# Patient Record
Sex: Female | Born: 1990 | Race: Black or African American | Hispanic: No | Marital: Single | Smoking: Never smoker
Health system: Southern US, Community
[De-identification: ages and names within clinical notes are randomized; demographics above are authoritative.]

## PROBLEM LIST (undated history)

## (undated) DIAGNOSIS — Z87442 Personal history of urinary calculi: Secondary | ICD-10-CM

## (undated) DIAGNOSIS — Z8379 Family history of other diseases of the digestive system: Secondary | ICD-10-CM

## (undated) DIAGNOSIS — N200 Calculus of kidney: Secondary | ICD-10-CM

## (undated) DIAGNOSIS — Z8659 Personal history of other mental and behavioral disorders: Secondary | ICD-10-CM

## (undated) DIAGNOSIS — Z87440 Personal history of urinary (tract) infections: Secondary | ICD-10-CM

## (undated) HISTORY — DX: Personal history of other mental and behavioral disorders: Z86.59

## (undated) HISTORY — DX: Personal history of urinary calculi: Z87.442

## (undated) HISTORY — DX: Family history of other diseases of the digestive system: Z83.79

## (undated) HISTORY — DX: Personal history of urinary (tract) infections: Z87.440

## (undated) NOTE — *Deleted (*Deleted)
07/30/2020 12:10 PM   Kathleen Kelley 1990/12/19 [id number]  Referring provider: Deborha Payment, PA-C [address] [place],  [place] [address] No chief complaint on file.   HPI: Kathleen Kelley is a 41 y.o. female who presents today for evaluation and management of nephrolithiasis.  -Patient has a history of nephrolithiasis. -She was previously seen at [facility] in 11/2019. -She had back pain, nausea, vomiting and back pain radiating to her LLQ. Pain was 10/10.  -UA noted 1+ blood and 1+ leukocytes. Urine culture was normal. She was given Flomax.  -She reports lower left sided back pain, lower abdominal pain and urgency x 5 days on 04/13/2020. -Back and flank pain were intermittent.  -Associated nausea, diarrhea, frequency, dysuria and abdominal discomfort.  -UA noted trace blood and rare bacteria. No urine culture. Treated with Cipro 500 mg BID x 7 days.       PMH: No past medical history on file.  Surgical History: No past surgical history on file.  Home Medications:  Allergies as of 07/30/2020   No Known Allergies     Medication List       Accurate as of July 29, 2020 12:10 PM. If you have any questions, ask your nurse or doctor.        azithromycin 250 MG tablet Commonly known as: Zithromax Z-Pak Take 2 tablets (500 mg) on  Day 1,  followed by 1 tablet (250 mg) once daily on Days 2 through 5.   guaiFENesin-codeine 100-10 MG/5ML syrup Take 10 mLs by mouth every 4 (four) hours as needed for cough.   PARoxetine 30 MG tablet Commonly known as: PAXIL Take 30 mg by mouth daily.   phenazopyridine 200 MG tablet Commonly known as: Pyridium Take 1 tablet (200 mg total) by mouth 3 (three) times daily as needed for pain.   pseudoephedrine 60 MG tablet Commonly known as: SUDAFED Take 1 tablet (60 mg total) by mouth every 4 (four) hours as needed for congestion.       Allergies: No Known Allergies  Family History: No family history on  file.  Social History:  reports that she has never smoked. She has never used smokeless tobacco. She reports that she does not drink alcohol and does not use drugs.   Physical Exam: There were no vitals taken for this visit.  Constitutional:  Alert and oriented, No acute distress. HEENT: [place] AT, moist mucus membranes.  Trachea midline, no masses. Cardiovascular: No clubbing, cyanosis, or edema. Respiratory: Normal respiratory effort, no increased work of breathing. GI: Abdomen is soft, nontender, nondistended, no abdominal masses GU: No CVA tenderness Lymph: No cervical or inguinal lymphadenopathy. Skin: No rashes, bruises or suspicious lesions. Neurologic: Grossly intact, no focal deficits, moving all 4 extremities. Psychiatric: Normal mood and affect.  Laboratory Data:  No results found for: CREATININE  No results found for: PSA  No results found for: TESTOSTERONE  No results found for: HGBA1C  Urinalysis   Pertinent Imaging: *** No results found for this or any previous visit.  No results found for this or any previous visit.  No results found for this or any previous visit.  No results found for this or any previous visit.  No results found for this or any previous visit.  No results found for this or any previous visit.  No results found for this or any previous visit.  No results found for this or any previous visit.   Assessment & Plan:  No follow-ups on file.  [facility] [address], [address] [place], [place] [address] [phone]  I, Theador Hawthorne, am acting as a scribe for Dr. Lorin Picket C. Stoioff,  {Add Holiday representative

---

## 2015-09-07 ENCOUNTER — Emergency Department
Admission: EM | Admit: 2015-09-07 | Discharge: 2015-09-07 | Disposition: A | Discharge status: Home / Self Care | Payer: Self-pay | Attending: Emergency Medicine | Admitting: Emergency Medicine

## 2015-09-07 ENCOUNTER — Encounter: Payer: Self-pay | Admitting: Emergency Medicine

## 2015-09-07 DIAGNOSIS — J069 Acute upper respiratory infection, unspecified: Secondary | ICD-10-CM | POA: Insufficient documentation

## 2015-09-07 DIAGNOSIS — Z79899 Other long term (current) drug therapy: Secondary | ICD-10-CM | POA: Insufficient documentation

## 2015-09-07 DIAGNOSIS — J0100 Acute maxillary sinusitis, unspecified: Secondary | ICD-10-CM | POA: Insufficient documentation

## 2015-09-07 MED ORDER — AZITHROMYCIN 250 MG PO TABS: ORAL_TABLET | ORAL | Status: DC

## 2015-09-07 MED ORDER — PSEUDOEPHEDRINE HCL 60 MG PO TABS: 60.0000 mg | ORAL_TABLET | ORAL | Status: DC | PRN

## 2015-09-07 MED ORDER — GUAIFENESIN-CODEINE 100-10 MG/5ML PO SOLN: 10.0000 mL | ORAL | Status: DC | PRN

## 2015-09-07 NOTE — ED Notes (Signed)
States she developed some sinus pressure about 4-5 days ago  Swelling noted under chin/ears  States she has green drainage for nose when she blows her nose

## 2015-09-07 NOTE — Discharge Instructions (Signed)
Cough, Adult A cough helps to clear your throat and lungs. A cough may last only 2-3 weeks (acute), or it may last longer than 8 weeks (chronic). Many different things can cause a cough. A cough may be a sign of an illness or another medical condition. HOME CARE  Pay attention to any changes in your cough.  Take medicines only as told by your doctor.  If you were prescribed an antibiotic medicine, take it as told by your doctor. Do not stop taking it even if you start to feel better.  Talk with your doctor before you try using a cough medicine.  Drink enough fluid to keep your pee (urine) clear or pale yellow.  If the air is dry, use a cold steam vaporizer or humidifier in your home.  Stay away from things that make you cough at work or at home.  If your cough is worse at night, try using extra pillows to raise your head up higher while you sleep.  Do not smoke, and try not to be around smoke. If you need help quitting, ask your doctor.  Do not have caffeine.  Do not drink alcohol.  Rest as needed. GET HELP IF:  You have new problems (symptoms).  You cough up yellow fluid (pus).  Your cough does not get better after 2-3 weeks, or your cough gets worse.  Medicine does not help your cough and you are not sleeping well.  You have pain that gets worse or pain that is not helped with medicine.  You have a fever.  You are losing weight and you do not know why.  You have night sweats. GET HELP RIGHT AWAY IF:  You cough up blood.  You have trouble breathing.  Your heartbeat is very fast.   This information is not intended to replace advice given to you by your health care provider. Make sure you discuss any questions you have with your health care provider.   Document Released: 05/26/2011 Document Revised: 06/03/2015 Document Reviewed: 11/19/2014 Elsevier Interactive Patient Education 2016 Elsevier Inc.  Sinusitis, Adult Sinusitis is redness, soreness, and  inflammation of the paranasal sinuses. Paranasal sinuses are air pockets within the bones of your face. They are located beneath your eyes, in the middle of your forehead, and above your eyes. In healthy paranasal sinuses, mucus is able to drain out, and air is able to circulate through them by way of your nose. However, when your paranasal sinuses are inflamed, mucus and air can become trapped. This can allow bacteria and other germs to grow and cause infection. Sinusitis can develop quickly and last only a short time (acute) or continue over a long period (chronic). Sinusitis that lasts for more than 12 weeks is considered chronic. CAUSES Causes of sinusitis include:  Allergies.  Structural abnormalities, such as displacement of the cartilage that separates your nostrils (deviated septum), which can decrease the air flow through your nose and sinuses and affect sinus drainage.  Functional abnormalities, such as when the small hairs (cilia) that line your sinuses and help remove mucus do not work properly or are not present. SIGNS AND SYMPTOMS Symptoms of acute and chronic sinusitis are the same. The primary symptoms are pain and pressure around the affected sinuses. Other symptoms include:  Upper toothache.  Earache.  Headache.  Bad breath.  Decreased sense of smell and taste.  A cough, which worsens when you are lying flat.  Fatigue.  Fever.  Thick drainage from your nose, which often  is green and may contain pus (purulent).  Swelling and warmth over the affected sinuses. DIAGNOSIS Your health care provider will perform a physical exam. During your exam, your health care provider may perform any of the following to help determine if you have acute sinusitis or chronic sinusitis:  Look in your nose for signs of abnormal growths in your nostrils (nasal polyps).  Tap over the affected sinus to check for signs of infection.  View the inside of your sinuses using an imaging  device that has a light attached (endoscope). If your health care provider suspects that you have chronic sinusitis, one or more of the following tests may be recommended:  Allergy tests.  Nasal culture. A sample of mucus is taken from your nose, sent to a lab, and screened for bacteria.  Nasal cytology. A sample of mucus is taken from your nose and examined by your health care provider to determine if your sinusitis is related to an allergy. TREATMENT Most cases of acute sinusitis are related to a viral infection and will resolve on their own within 10 days. Sometimes, medicines are prescribed to help relieve symptoms of both acute and chronic sinusitis. These may include pain medicines, decongestants, nasal steroid sprays, or saline sprays. However, for sinusitis related to a bacterial infection, your health care provider will prescribe antibiotic medicines. These are medicines that will help kill the bacteria causing the infection. Rarely, sinusitis is caused by a fungal infection. In these cases, your health care provider will prescribe antifungal medicine. For some cases of chronic sinusitis, surgery is needed. Generally, these are cases in which sinusitis recurs more than 3 times per year, despite other treatments. HOME CARE INSTRUCTIONS  Drink plenty of water. Water helps thin the mucus so your sinuses can drain more easily.  Use a humidifier.  Inhale steam 3-4 times a day (for example, sit in the bathroom with the shower running).  Apply a warm, moist washcloth to your face 3-4 times a day, or as directed by your health care provider.  Use saline nasal sprays to help moisten and clean your sinuses.  Take medicines only as directed by your health care provider.  If you were prescribed either an antibiotic or antifungal medicine, finish it all even if you start to feel better. SEEK IMMEDIATE MEDICAL CARE IF:  You have increasing pain or severe headaches.  You have nausea,  vomiting, or drowsiness.  You have swelling around your face.  You have vision problems.  You have a stiff neck.  You have difficulty breathing.   This information is not intended to replace advice given to you by your health care provider. Make sure you discuss any questions you have with your health care provider.   Document Released: 09/12/2005 Document Revised: 10/03/2014 Document Reviewed: 09/27/2011 Elsevier Interactive Patient Education 2016 Elsevier Inc.  Upper Respiratory Infection, Adult Most upper respiratory infections (URIs) are a viral infection of the air passages leading to the lungs. A URI affects the nose, throat, and upper air passages. The most common type of URI is nasopharyngitis and is typically referred to as "the common cold." URIs run their course and usually go away on their own. Most of the time, a URI does not require medical attention, but sometimes a bacterial infection in the upper airways can follow a viral infection. This is called a secondary infection. Sinus and middle ear infections are common types of secondary upper respiratory infections. Bacterial pneumonia can also complicate a URI. A URI can  worsen asthma and chronic obstructive pulmonary disease (COPD). Sometimes, these complications can require emergency medical care and may be life threatening.  CAUSES Almost all URIs are caused by viruses. A virus is a type of germ and can spread from one person to another.  RISKS FACTORS You may be at risk for a URI if:   You smoke.   You have chronic heart or lung disease.  You have a weakened defense (immune) system.   You are very young or very old.   You have nasal allergies or asthma.  You work in crowded or poorly ventilated areas.  You work in health care facilities or schools. SIGNS AND SYMPTOMS  Symptoms typically develop 2-3 days after you come in contact with a cold virus. Most viral URIs last 7-10 days. However, viral URIs from the  influenza virus (flu virus) can last 14-18 days and are typically more severe. Symptoms may include:   Runny or stuffy (congested) nose.   Sneezing.   Cough.   Sore throat.   Headache.   Fatigue.   Fever.   Loss of appetite.   Pain in your forehead, behind your eyes, and over your cheekbones (sinus pain).  Muscle aches.  DIAGNOSIS  Your health care provider may diagnose a URI by:  Physical exam.  Tests to check that your symptoms are not due to another condition such as:  Strep throat.  Sinusitis.  Pneumonia.  Asthma. TREATMENT  A URI goes away on its own with time. It cannot be cured with medicines, but medicines may be prescribed or recommended to relieve symptoms. Medicines may help:  Reduce your fever.  Reduce your cough.  Relieve nasal congestion. HOME CARE INSTRUCTIONS   Take medicines only as directed by your health care provider.   Gargle warm saltwater or take cough drops to comfort your throat as directed by your health care provider.  Use a warm mist humidifier or inhale steam from a shower to increase air moisture. This may make it easier to breathe.  Drink enough fluid to keep your urine clear or pale yellow.   Eat soups and other clear broths and maintain good nutrition.   Rest as needed.   Return to work when your temperature has returned to normal or as your health care provider advises. You may need to stay home longer to avoid infecting others. You can also use a face mask and careful hand washing to prevent spread of the virus.  Increase the usage of your inhaler if you have asthma.   Do not use any tobacco products, including cigarettes, chewing tobacco, or electronic cigarettes. If you need help quitting, ask your health care provider. PREVENTION  The best way to protect yourself from getting a cold is to practice good hygiene.   Avoid oral or hand contact with people with cold symptoms.   Wash your hands often if  contact occurs.  There is no clear evidence that vitamin C, vitamin E, echinacea, or exercise reduces the chance of developing a cold. However, it is always recommended to get plenty of rest, exercise, and practice good nutrition.  SEEK MEDICAL CARE IF:   You are getting worse rather than better.   Your symptoms are not controlled by medicine.   You have chills.  You have worsening shortness of breath.  You have brown or red mucus.  You have yellow or brown nasal discharge.  You have pain in your face, especially when you bend forward.  You have a  fever.  You have swollen neck glands.  You have pain while swallowing.  You have white areas in the back of your throat. SEEK IMMEDIATE MEDICAL CARE IF:   You have severe or persistent:  Headache.  Ear pain.  Sinus pain.  Chest pain.  You have chronic lung disease and any of the following:  Wheezing.  Prolonged cough.  Coughing up blood.  A change in your usual mucus.  You have a stiff neck.  You have changes in your:  Vision.  Hearing.  Thinking.  Mood. MAKE SURE YOU:   Understand these instructions.  Will watch your condition.  Will get help right away if you are not doing well or get worse.   This information is not intended to replace advice given to you by your health care provider. Make sure you discuss any questions you have with your health care provider.   Document Released: 03/08/2001 Document Revised: 01/27/2015 Document Reviewed: 12/18/2013 Elsevier Interactive Patient Education [employer]

## 2015-09-07 NOTE — ED Provider Notes (Signed)
[facility] Emergency Department Provider Note  ____________________________________________  Time seen: Approximately 6:43 PM  I have reviewed the triage vital signs and the nursing notes.   HISTORY  Chief Complaint Facial Pain and Sore Throat    HPI Sherilyn CooterShanice Kelley is a 24 y.o. female who presents for evaluation of sinus pain and fever sore throat for 4 days. Patient states bloody discharge when blowing her nose. In addition screening discharge mixed in with it. Patient denies any active fever. Takes Tylenol over-the-counter as needed.   History reviewed. No pertinent past medical history.  There are no active problems to display for this patient.   History reviewed. No pertinent past surgical history.  Current Outpatient Rx  Name  Route  Sig  Dispense  Refill  . PARoxetine (PAXIL) 30 MG tablet   Oral   Take 30 mg by mouth daily.         Marland Kitchen. azithromycin (ZITHROMAX Z-PAK) 250 MG tablet      Take 2 tablets (500 mg) on  Day 1,  followed by 1 tablet (250 mg) once daily on Days 2 through 5.   6 each   0   . guaiFENesin-codeine 100-10 MG/5ML syrup   Oral   Take 10 mLs by mouth every 4 (four) hours as needed for cough.   180 mL   0   . pseudoephedrine (SUDAFED) 60 MG tablet   Oral   Take 1 tablet (60 mg total) by mouth every 4 (four) hours as needed for congestion.   24 tablet   0     Allergies Review of patient's allergies indicates no known allergies.  No family history on file.  Social History Social History  Substance Use Topics  . Smoking status: Never Smoker   . Smokeless tobacco: None  . Alcohol Use: No    Review of Systems Constitutional: No fever/chills Eyes: No visual changes. ENT: Positive sore throat. Positive sinus congestion and pressure. The nasal discharge. Cardiovascular: Denies chest pain. Respiratory: Denies shortness of breath. Visual cough. Gastrointestinal: No abdominal pain.  No nausea, no vomiting.  No  diarrhea.  No constipation. Genitourinary: Negative for dysuria. Musculoskeletal: Negative for back pain. Positive for body aches. Skin: Negative for rash. Neurological: Negative for headaches, focal weakness or numbness.  10-point ROS otherwise negative.  ____________________________________________   PHYSICAL EXAM:  VITAL SIGNS: ED Triage Vitals  Enc Vitals Group     BP 09/07/15 1759 148/84 mmHg     Pulse Rate 09/07/15 1757 87     Resp 09/07/15 1757 18     Temp 09/07/15 1757 98.9 F (37.2 C)     Temp Source 09/07/15 1757 Oral     SpO2 09/07/15 1757 98 %     Weight 09/07/15 1757 220 lb (99.791 kg)     Height 09/07/15 1757 5\' 4"  (1.626 m)     Head Cir --      Peak Flow --      Pain Score 09/07/15 1807 5     Pain Loc --      Pain Edu? --      Excl. in GC? --     Constitutional: Alert and oriented. Well appearing and in no acute distress. Eyes: Conjunctivae are normal. PERRL. EOMI. Head: Atraumatic. Nose: No congestion/rhinnorhea. Mouth/Throat: Mucous membranes are moist.  Oropharynx non-erythematous. Nasal turbinates swollen bilaterally with greenish discharge noted within the cavity Neck: No stridor.   Cardiovascular: Normal rate, regular rhythm. Grossly normal heart sounds.  Good peripheral  circulation. Respiratory: Normal respiratory effort.  No retractions. Lungs CTAB. No active rhonchi or wheezing noted. Musculoskeletal: No lower extremity tenderness nor edema.  No joint effusions. Neurologic:  Normal speech and language. No gross focal neurologic deficits are appreciated. No gait instability. Skin:  Skin is warm, dry and intact. No rash noted. Psychiatric: Mood and affect are normal. Speech and behavior are normal.  ____________________________________________   LABS (all labs ordered are listed, but only abnormal results are displayed)  Labs Reviewed - No data to display   PROCEDURES  Procedure(s) performed: None  Critical Care performed:  No  ____________________________________________   INITIAL IMPRESSION / ASSESSMENT AND PLAN / ED COURSE  Pertinent labs & imaging results that were available during my care of the patient were reviewed by me and considered in my medical decision making (see chart for details).  Acute maxillary sinusitis. Rx given for cough medicine with codeine. Rx given for Zithromax. Rx given for Sudafed 60 mg. Patient follow-up with PCP or return here with any worsening symptomology. Patient voices no other emergency medical complaints at this time. ____________________________________________   FINAL CLINICAL IMPRESSION(S) / ED DIAGNOSES  Final diagnoses:  Acute maxillary sinusitis, recurrence not specified  Acute URI      Evangeline Dakin, PA-C 09/07/15 1856  Darien Ramus, MD 09/07/15 2104

## 2015-09-07 NOTE — ED Notes (Signed)
Sinus pain, fever, sore throat for 4 days now. Pt appears in no distress.

## 2016-03-03 ENCOUNTER — Other Ambulatory Visit: Payer: Self-pay | Admitting: Internal Medicine

## 2016-03-03 DIAGNOSIS — R1011 Right upper quadrant pain: Secondary | ICD-10-CM

## 2016-03-07 ENCOUNTER — Ambulatory Visit
Admission: RE | Admit: 2016-03-07 | Discharge: 2016-03-07 | Disposition: A | Discharge status: Home / Self Care | Payer: Self-pay | Source: Ambulatory Visit | Attending: Internal Medicine | Admitting: Internal Medicine

## 2016-03-07 DIAGNOSIS — R1011 Right upper quadrant pain: Secondary | ICD-10-CM | POA: Insufficient documentation

## 2016-03-07 DIAGNOSIS — R932 Abnormal findings on diagnostic imaging of liver and biliary tract: Secondary | ICD-10-CM | POA: Insufficient documentation

## 2018-04-18 IMAGING — US US ABDOMEN LIMITED
1 series · 14 of 25 positions shown · non-contrast
Comparison: None.

CLINICAL DATA: Right upper quadrant pain for 2 weeks

EXAM:
US ABDOMEN LIMITED - RIGHT UPPER QUADRANT

[Series 1: us abdomen limited · 0.21mm/px · 14 of 70 slices shown]
[im 1/70]
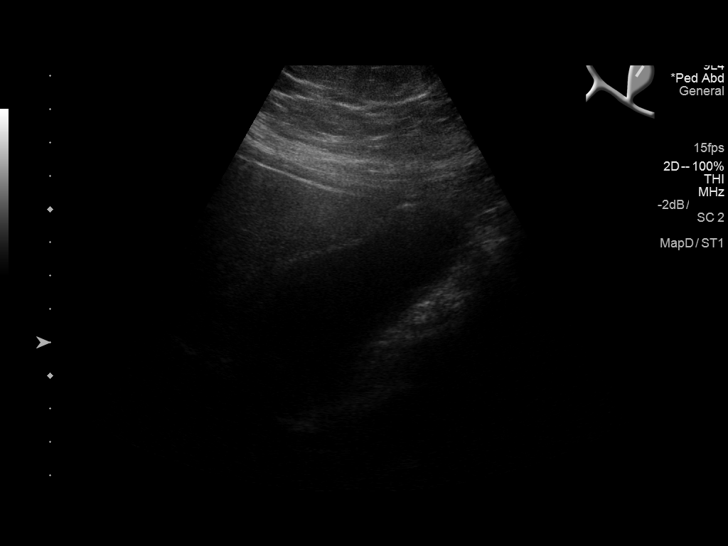
[im 6/70]
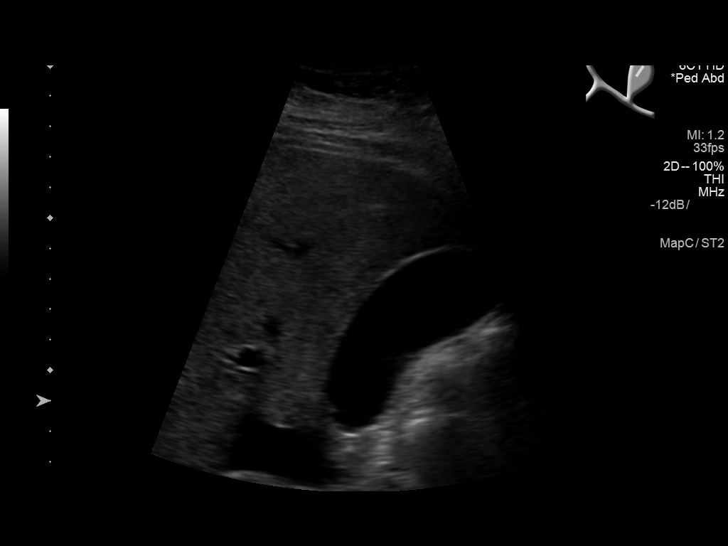
[im 12/70]
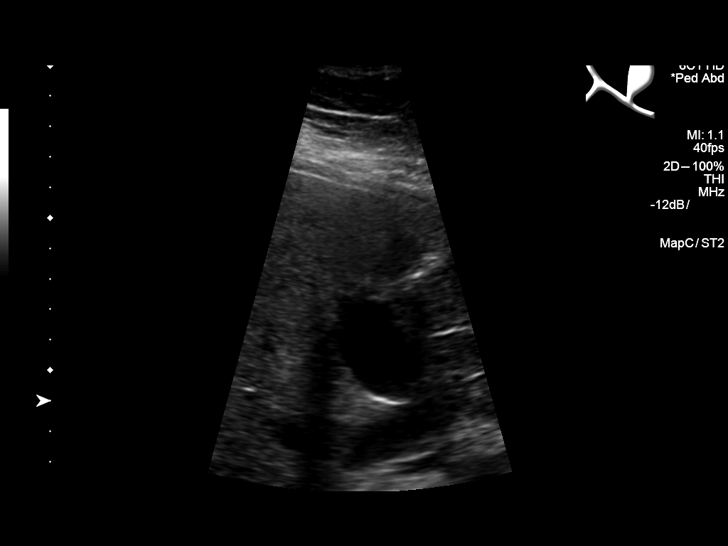
[im 18/70]
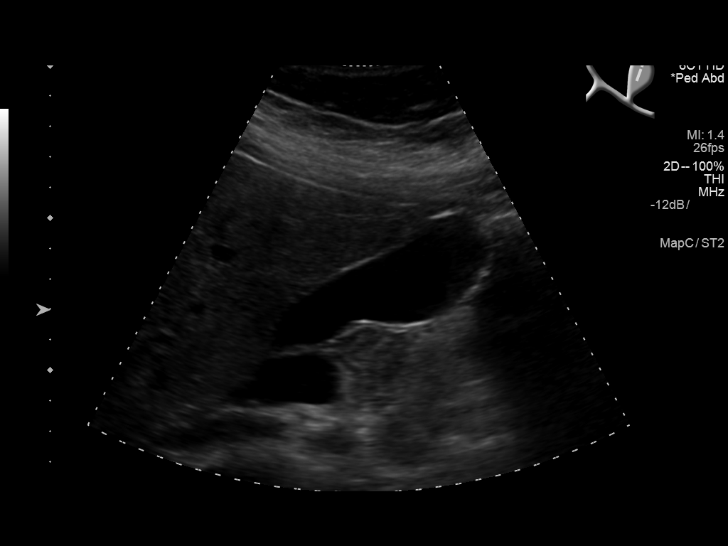
[im 24/70]
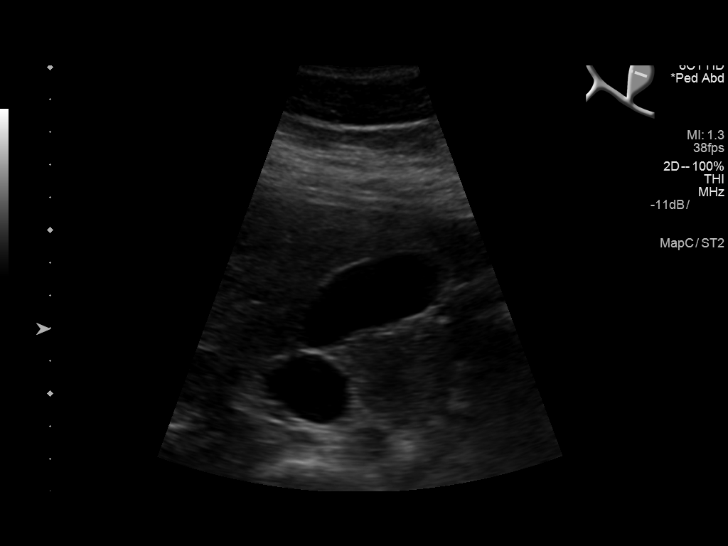
[im 26/70]
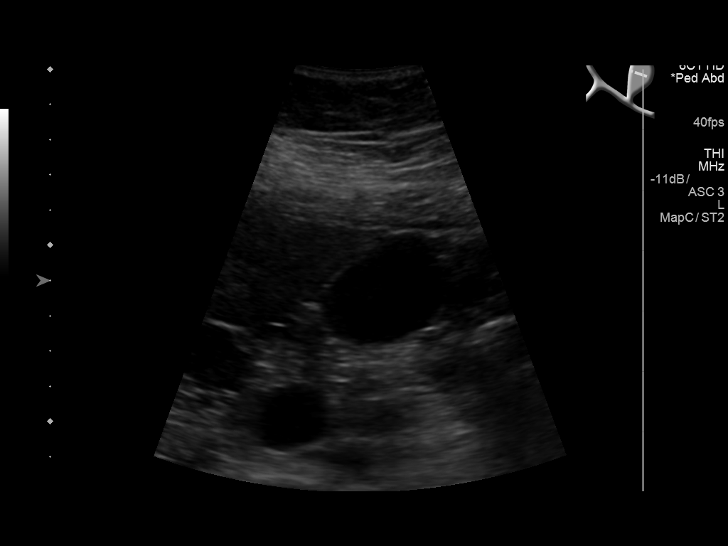
[im 32/70]
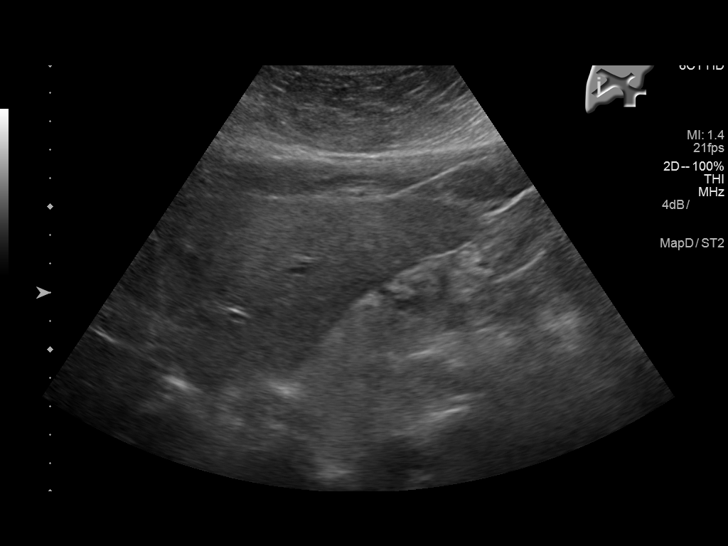
[im 38/70]
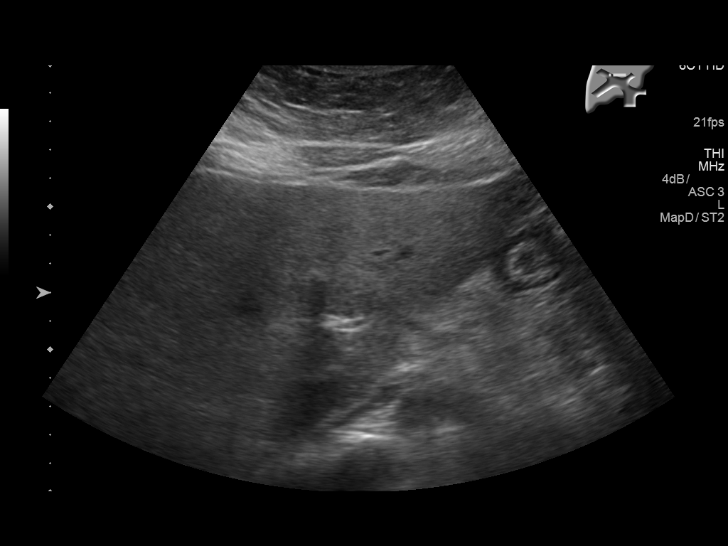
[im 44/70]
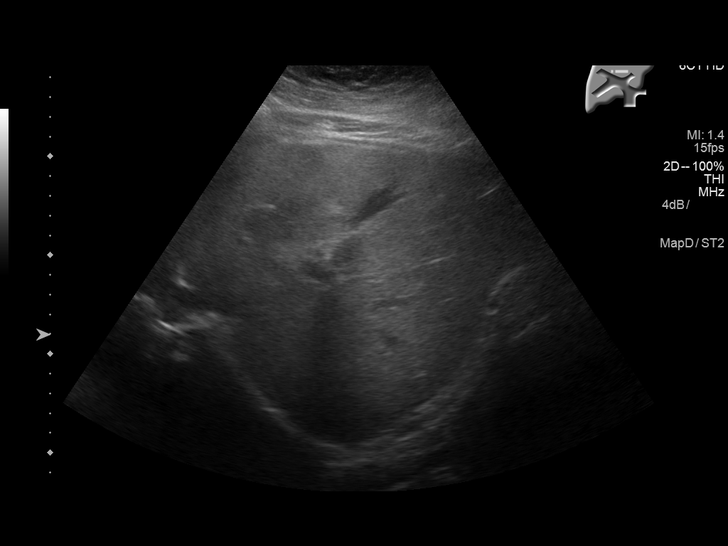
[im 47/70]
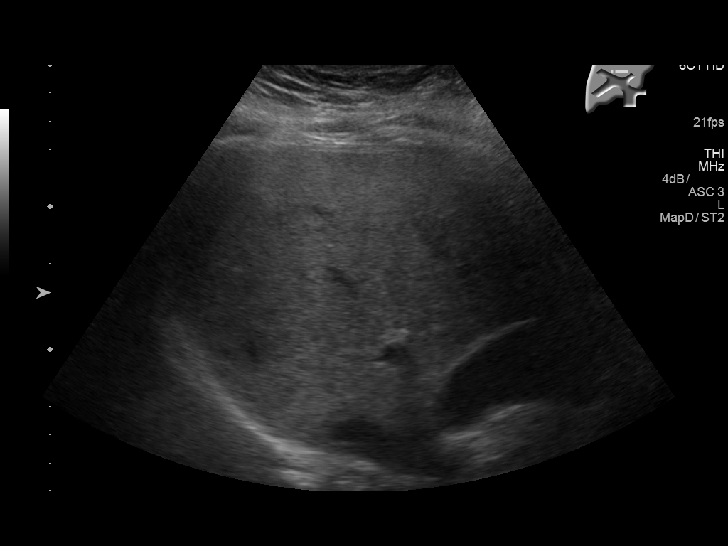
[im 52/70]
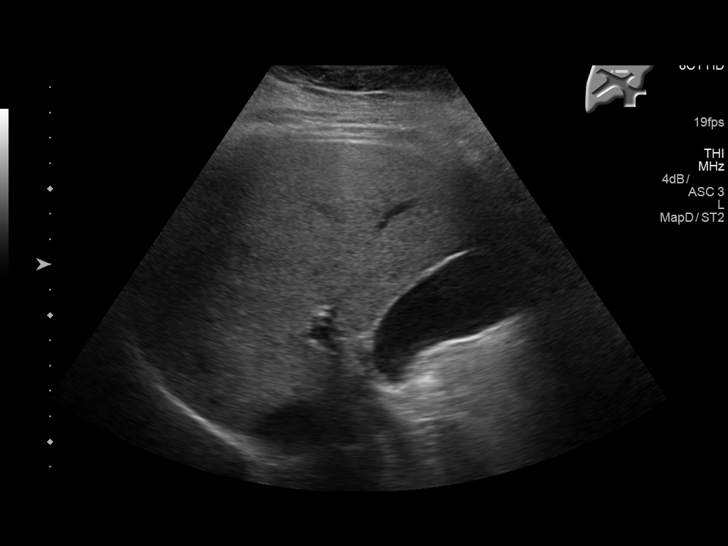
[im 58/70]
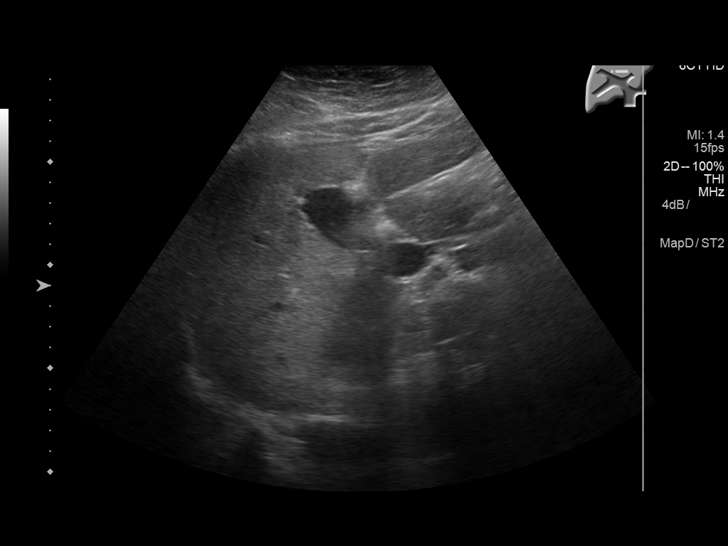
[im 64/70]
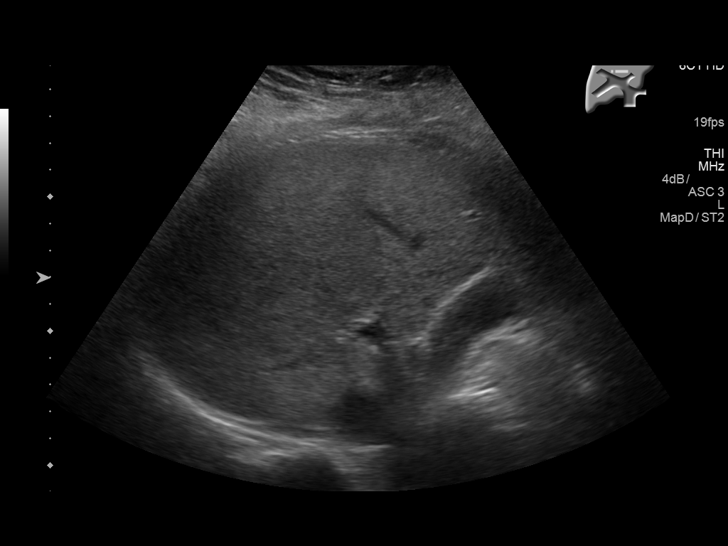
[im 70/70]
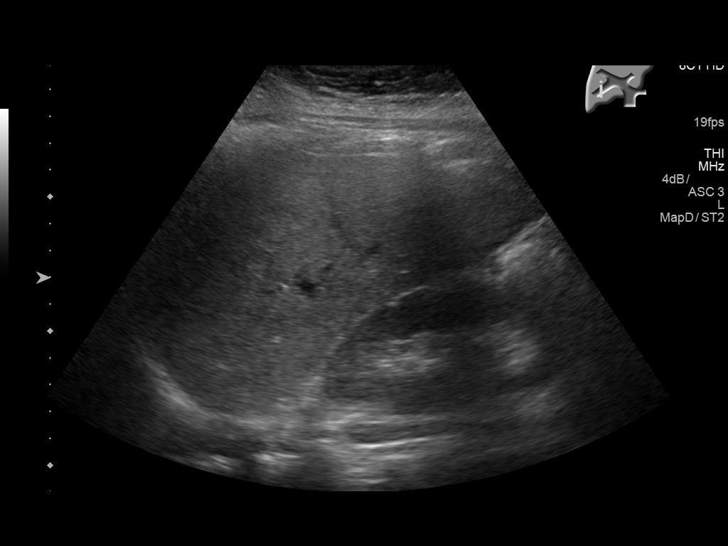

[14 of 25 positions shown; findings below may reference images not displayed]

FINDINGS: Gallbladder:

No gallstones or wall thickening visualized. There is no
pericholecystic fluid. No sonographic Murphy sign noted by
sonographer.

Common bile duct:

Diameter: 5 mm. There is no intrahepatic or extrahepatic biliary
duct dilatation.

Liver:

No focal lesion identified.  Liver echogenicity is increased.
IMPRESSION: Liver echogenicity is increased, a finding most likely indicative of
hepatic steatosis. While no focal liver lesions are identified, it
must be cautioned that the sensitivity of ultrasound for focal liver
lesions is diminished in this circumstance. Study otherwise
unremarkable.

## 2018-10-12 ENCOUNTER — Emergency Department
Admission: EM | Admit: 2018-10-12 | Discharge: 2018-10-12 | Disposition: A | Discharge status: Home / Self Care | Payer: Self-pay | Attending: Emergency Medicine | Admitting: Emergency Medicine

## 2018-10-12 ENCOUNTER — Other Ambulatory Visit: Payer: Self-pay

## 2018-10-12 DIAGNOSIS — Z79899 Other long term (current) drug therapy: Secondary | ICD-10-CM | POA: Insufficient documentation

## 2018-10-12 DIAGNOSIS — N3000 Acute cystitis without hematuria: Secondary | ICD-10-CM | POA: Insufficient documentation

## 2018-10-12 LAB — URINALYSIS, COMPLETE (UACMP) WITH MICROSCOPIC
Bilirubin Urine: NEGATIVE
Glucose, UA: NEGATIVE mg/dL
Hgb urine dipstick: NEGATIVE
Ketones, ur: 5 mg/dL — AB
Leukocytes, UA: NEGATIVE
NITRITE: POSITIVE — AB
Protein, ur: NEGATIVE mg/dL
SPECIFIC GRAVITY, URINE: 1.018 (ref 1.005–1.030)
pH: 6 (ref 5.0–8.0)

## 2018-10-12 LAB — PREGNANCY, URINE: PREG TEST UR: NEGATIVE

## 2018-10-12 MED ORDER — PHENAZOPYRIDINE HCL 200 MG PO TABS: 200.0000 mg | ORAL_TABLET | Freq: Three times a day (TID) | ORAL | 0 refills | Status: DC | PRN

## 2018-10-12 MED ORDER — CEPHALEXIN 500 MG PO CAPS: 500.0000 mg | ORAL_CAPSULE | Freq: Two times a day (BID) | ORAL | 0 refills | Status: AC

## 2018-10-12 NOTE — ED Triage Notes (Signed)
Pt c/o lower back pain with urgency to void, pain and burning with urination  x1 month

## 2018-10-12 NOTE — ED Provider Notes (Signed)
[facility] Emergency Department Provider Note  ____________________________________________  Time seen: Approximately 7:04 PM  I have reviewed the triage vital signs and the nursing notes.   HISTORY  Chief Complaint Urinary Tract Infection    HPI Kathleen Kelley is a 28 y.o. female who presents to the emergency department for "uti." She complains of urgency, frequency, and burning for the past 2 weeks. Back pain has intensified today. History of UTIs and kidney stones. Back pain improved with ibuprofen. She has used AZO without relief of dysuria. No fever or chills. No nausea or vomiting.   History reviewed. No pertinent past medical history.  There are no active problems to display for this patient.   History reviewed. No pertinent surgical history.  Prior to Admission medications   Medication Sig Start Date End Date Taking? Authorizing Provider  azithromycin (ZITHROMAX Z-PAK) 250 MG tablet Take 2 tablets (500 mg) on  Day 1,  followed by 1 tablet (250 mg) once daily on Days 2 through 5. 09/07/15   Beers, Charmayne Sheer, PA-C  cephALEXin (KEFLEX) 500 MG capsule Take 1 capsule (500 mg total) by mouth 2 (two) times daily for 7 days. 10/12/18 10/19/18  Gwendy Boeder, Rulon Eisenmenger B, FNP  guaiFENesin-codeine 100-10 MG/5ML syrup Take 10 mLs by mouth every 4 (four) hours as needed for cough. 09/07/15   Beers, Charmayne Sheer, PA-C  PARoxetine (PAXIL) 30 MG tablet Take 30 mg by mouth daily.    [provider]  phenazopyridine (PYRIDIUM) 200 MG tablet Take 1 tablet (200 mg total) by mouth 3 (three) times daily as needed for pain. 10/12/18   Bentli Llorente, Rulon Eisenmenger B, FNP  pseudoephedrine (SUDAFED) 60 MG tablet Take 1 tablet (60 mg total) by mouth every 4 (four) hours as needed for congestion. 09/07/15   Evangeline Dakin, PA-C    Allergies Patient has no known allergies.  No family history on file.  Social History Social History   Tobacco Use  . Smoking status: Never Smoker  .  Smokeless tobacco: Never Used  Substance Use Topics  . Alcohol use: No  . Drug use: Never    Review of Systems Constitutional: Negative for fever. Respiratory: Negative for shortness of breath or cough. Gastrointestinal: negative for abdominal pain; no for nausea , negative for vomiting. Genitourinary: Positive for dysuria , Positive for vaginal discharge. Musculoskeletal: Positive for back pain. Skin: Negative for acute skin changes/rash/lesion. ____________________________________________   PHYSICAL EXAM:  VITAL SIGNS: ED Triage Vitals  Enc Vitals Group     BP 10/12/18 1751 134/72     Pulse Rate 10/12/18 1751 64     Resp 10/12/18 1751 18     Temp 10/12/18 1751 98.6 F (37 C)     Temp Source 10/12/18 1751 Oral     SpO2 10/12/18 1751 100 %     Weight 10/12/18 1812 210 lb (95.3 kg)     Height 10/12/18 1812 5\' 4"  (1.626 m)     Head Circumference --      Peak Flow --      Pain Score 10/12/18 1812 6     Pain Loc --      Pain Edu? --      Excl. in GC? --     Constitutional: Alert and oriented. Well appearing and in no acute distress. Eyes: Conjunctivae are normal. Head: Atraumatic. Nose: No congestion/rhinnorhea. Mouth/Throat: Mucous membranes are moist. Respiratory: Normal respiratory effort.  No retractions. Gastrointestinal: Bowel sounds active x 4; Abdomen is soft without rebound or guarding.  Genitourinary: Pelvic exam: Not indicated. Musculoskeletal: No extremity tenderness nor edema.  Neurologic:  Normal speech and language. No gross focal neurologic deficits are appreciated. Speech is normal. No gait instability. Skin:  Skin is warm, dry and intact. No rash noted on exposed skin. Psychiatric: Mood and affect are normal. Speech and behavior are normal.  ____________________________________________   LABS (all labs ordered are listed, but only abnormal results are displayed)  Labs Reviewed  URINALYSIS, COMPLETE (UACMP) WITH MICROSCOPIC - Abnormal; Notable for  the following components:      Result Value   Color, Urine AMBER (*)    APPearance CLEAR (*)    Ketones, ur 5 (*)    Nitrite POSITIVE (*)    Bacteria, UA RARE (*)    All other components within normal limits  PREGNANCY, URINE   ____________________________________________  RADIOLOGY  Not indicated. ____________________________________________  Procedures  ____________________________________________  28 year old female presenting with a symptomatic acute cystitis.  She will be treated with Keflex and Pyridium.  She was advised to follow-up with primary care or return to the emergency department for symptoms that are not improving over the next few days.  INITIAL IMPRESSION / ASSESSMENT AND PLAN / ED COURSE  Pertinent labs & imaging results that were available during my care of the patient were reviewed by me and considered in my medical decision making (see chart for details).  ____________________________________________   FINAL CLINICAL IMPRESSION(S) / ED DIAGNOSES  Final diagnoses:  Acute cystitis without hematuria    Note:  This document was prepared using Dragon voice recognition software and may include unintentional dictation errors.    Chinita Pester, FNP 10/13/18 0007    Arnaldo Natal, MD 10/13/18 0110

## 2018-10-12 NOTE — Discharge Instructions (Signed)
Please follow-up with your primary care provider if not improving over the next few days.  Return to the emergency department for symptoms of change or worsen if unable to schedule appointment.

## 2020-05-07 ENCOUNTER — Ambulatory Visit: Payer: Medicaid Other | Admitting: Urology

## 2020-06-03 ENCOUNTER — Ambulatory Visit: Payer: Medicaid Other | Admitting: Urology

## 2020-07-30 ENCOUNTER — Ambulatory Visit: Payer: Medicaid Other | Admitting: Urology

## 2020-07-31 ENCOUNTER — Encounter: Payer: Self-pay | Admitting: Urology

## 2020-08-07 ENCOUNTER — Ambulatory Visit
Admission: RE | Admit: 2020-08-07 | Discharge: 2020-08-07 | Disposition: A | Discharge status: Home / Self Care | Payer: BC Managed Care – PPO | Source: Ambulatory Visit | Attending: Urology | Admitting: Urology

## 2020-08-07 ENCOUNTER — Encounter: Payer: Self-pay | Admitting: Urology

## 2020-08-07 ENCOUNTER — Other Ambulatory Visit: Payer: Self-pay

## 2020-08-07 ENCOUNTER — Ambulatory Visit (INDEPENDENT_AMBULATORY_CARE_PROVIDER_SITE_OTHER): Payer: BC Managed Care – PPO | Admitting: Urology

## 2020-08-07 VITALS — BP 144/85 | HR 58 | Ht 64.0 in | Wt 194.6 lb

## 2020-08-07 DIAGNOSIS — N200 Calculus of kidney: Secondary | ICD-10-CM | POA: Insufficient documentation

## 2020-08-07 LAB — URINALYSIS, COMPLETE
Bilirubin, UA: NEGATIVE
Glucose, UA: NEGATIVE
Leukocytes,UA: NEGATIVE
Nitrite, UA: NEGATIVE
Protein,UA: NEGATIVE
Specific Gravity, UA: 1.025 (ref 1.005–1.030)
Urobilinogen, Ur: 0.2 mg/dL (ref 0.2–1.0)
pH, UA: 6 (ref 5.0–7.5)

## 2020-08-07 LAB — MICROSCOPIC EXAMINATION

## 2020-08-07 NOTE — Patient Instructions (Signed)

## 2020-08-07 NOTE — Progress Notes (Signed)
° °  08/07/2020 12:56 PM   Kathleen Kelley 06/18/1991 [id number]  Referring provider: Alvina Filbert, MD [address] [place],  [place] [address]  Chief Complaint  Patient presents with   Nephrolithiasis    HPI: 29 y.o. female presents for evaluation of recurrent stone disease.   Relates to history of stone disease passing small stones  Was seen by her PCP July 2021 complaining of left back pain, lower abdominal pain and urgency  Urinalysis showed trace blood but no microscopic hematuria  KUB was ordered however the report is not in Epic, patient states she was told there was a stone in the left kidney  Pain subsequently resolved  Has noted intermittent mild right side pain x2 days  No bothersome LUTS  No prior intervention for urinary calculi   PMH: History reviewed. No pertinent past medical history.  Surgical History: History reviewed. No pertinent surgical history.  Home Medications:  Allergies as of 08/07/2020   No Known Allergies     Medication List       Accurate as of August 07, 2020 12:56 PM. If you have any questions, ask your nurse or doctor.        STOP taking these medications   azithromycin 250 MG tablet Commonly known as: Zithromax Z-Pak Stopped by: Riki Altes, MD   guaiFENesin-codeine 100-10 MG/5ML syrup Stopped by: Riki Altes, MD   phenazopyridine 200 MG tablet Commonly known as: Pyridium Stopped by: Riki Altes, MD   pseudoephedrine 60 MG tablet Commonly known as: SUDAFED Stopped by: Riki Altes, MD     TAKE these medications   PARoxetine 30 MG tablet Commonly known as: PAXIL Take 30 mg by mouth daily.       Allergies: No Known Allergies  Family History: History reviewed. No pertinent family history.  Social History:  reports that she has never smoked. She has never used smokeless tobacco. She reports that she does not drink alcohol and does not use drugs.   Physical Exam: BP (!) 144/85     Pulse (!) 58    Ht 5\' 4"  (1.626 m)    Wt 194 lb 9.6 oz (88.3 kg)    BMI 33.40 kg/m   Constitutional:  Alert and oriented, No acute distress. HEENT: [place] AT, moist mucus membranes.  Trachea midline, no masses. Cardiovascular: No clubbing, cyanosis, or edema. Respiratory: Normal respiratory effort, no increased work of breathing. GI: Abdomen is soft, nontender, nondistended, no abdominal masses GU: No CVA tenderness Skin: No rashes, bruises or suspicious lesions. Neurologic: Grossly intact, no focal deficits, moving all 4 extremities. Psychiatric: Normal mood and affect.  Laboratory Data:  Urinalysis Dipstick trace intact blood/1+ ketone Microscopy negative  Assessment & Plan:    1. Nephrolithiasis  Calculus apparently noted on KUB July 2021  Repeat KUB today  Schedule renal ultrasound; will call with results      We discussed a metabolic evaluation including August 2021 urine study and blood work and she desires to pursue   22-GURK, MD  [facility] [address] [place], [place] [phone]

## 2020-08-09 ENCOUNTER — Encounter: Payer: Self-pay | Admitting: Urology

## 2020-08-09 NOTE — Progress Notes (Signed)
KUB shows a small stone overlying the left kidney.  Will await on renal ultrasound results.

## 2020-08-10 ENCOUNTER — Telehealth: Payer: Self-pay | Admitting: *Deleted

## 2020-08-10 NOTE — Telephone Encounter (Signed)
Notified patient as instructed, patient pleased °

## 2020-08-10 NOTE — Telephone Encounter (Signed)
-----   Message from Riki Altes, MD sent at 08/09/2020  9:11 AM EST ----- KUB shows a small stone overlying the left kidney.  Will await on renal ultrasound results.

## 2020-08-19 ENCOUNTER — Ambulatory Visit
Admission: RE | Admit: 2020-08-19 | Discharge: 2020-08-19 | Disposition: A | Discharge status: Home / Self Care | Payer: BC Managed Care – PPO | Source: Ambulatory Visit | Attending: Urology | Admitting: Urology

## 2020-08-19 ENCOUNTER — Other Ambulatory Visit: Payer: Self-pay

## 2020-08-19 DIAGNOSIS — N200 Calculus of kidney: Secondary | ICD-10-CM | POA: Insufficient documentation

## 2020-08-24 ENCOUNTER — Telehealth: Payer: Self-pay | Admitting: *Deleted

## 2020-08-24 NOTE — Telephone Encounter (Signed)
-----   Message from Riki Altes, MD sent at 08/24/2020  7:40 AM EST ----- Renal ultrasound showed no right sided calculi.  There were small, nonobstructing left renal calculi and one measuring approximately 6 mm.  Options include observation and ureteroscopic removal.  If she desires observation would recommend a follow-up appointment 6 months with KUB.  Would also recommend a metabolic evaluation through [facility]

## 2020-08-31 NOTE — Telephone Encounter (Signed)
Notified patient as instructed, patient will call and let us know what she wants to do. She will call and schedule [facility].

## 2020-10-27 ENCOUNTER — Ambulatory Visit: Payer: Self-pay

## 2021-05-24 ENCOUNTER — Other Ambulatory Visit: Payer: Self-pay

## 2021-05-24 ENCOUNTER — Encounter: Payer: Self-pay | Admitting: Physical Therapy

## 2021-05-24 ENCOUNTER — Ambulatory Visit: Payer: BC Managed Care – PPO | Attending: Obstetrics and Gynecology | Admitting: Physical Therapy

## 2021-05-24 DIAGNOSIS — R102 Pelvic and perineal pain: Secondary | ICD-10-CM | POA: Insufficient documentation

## 2021-05-24 DIAGNOSIS — R278 Other lack of coordination: Secondary | ICD-10-CM | POA: Diagnosis present

## 2021-05-24 DIAGNOSIS — M6289 Other specified disorders of muscle: Secondary | ICD-10-CM | POA: Diagnosis present

## 2021-05-24 NOTE — Therapy (Signed)
[facility] [address] [place], [place], [address] Phone: [phone]   Fax:  [phone]  Physical Therapy Evaluation  Patient Details  Name: Kathleen Kelley MRN: [id number] Date of Birth: 05-26-1991 Referring Provider (PT): Schermerhorn, Maisie Fus  Encounter Date: 05/24/2021   PT End of Session - 05/24/21 1414     Visit Number 1    Number of Visits 12    Date for PT Re-Evaluation 08/16/21    Authorization Type IE: 05/24/2021    PT Start Time 1415    PT Stop Time 1455    PT Time Calculation (min) 40 min    Activity Tolerance Patient tolerated treatment well    Behavior During Therapy [facility] for tasks assessed/performed             History reviewed. No pertinent past medical history.  History reviewed. No pertinent surgical history.  There were no vitals filed for this visit.   PELVIC FLOOR PHYSICAL THERAPY EVALUATION   SCREENING Red Flags: none  Have you had any night sweats? Unexplained weight loss? Saddle anesthesia? Waking up at night with pain? Unexplained changes in bowel or bladder habits?   Mental Status Patient is oriented to person, place and time.  Recent memory is intact.  Remote memory is intact.  Attention span and concentration are intact.  Expressive speech is intact.  Patient's fund of knowledge is within normal limits for educational level.  POSTURE/OBSERVATIONS:  Grossly increased rounded shoulders in sitting, increased adduction of B hips in sitting  GAIT: Grossly WNL  SUBJECTIVE  Chief Complaint: Patient has attempted 2 Pap smears in the past but doctors have been unable to complete due to excruciating pain, 8/10. The pain is described as a "scratchy," nail-like feeling particularly on the L side. Patient reports doctors have told her, "you are pushing out" during the exam. Patient knows she is not consciously trying to resist the doctors. After first sexual experience, patient had similar  pain with initial penetration of a finger and unable to continue due to increased pain. Patient has tried to look up reasons for her pain and tried to use dilators. She reports using the smallest size dilator with a mirror and she feels like she encounters a "wall" and stops. Patient reports currently having a kidney stone but denies low back or flank pain. Patient has noticed black tea and now, Vitamin C causes her to have urinary frequency, which she treats with cranberry tablets.   Pertinent History:  Falls: negative  Pulmonary disease/dysfunction: negative   Obstetrical History: G0P0  Gynecological History: Hysterectomy: no Endometriosis: negative  Pelvic Organ Prolapse: negative  Last Menstrual Period: August 13th Pain with exam: yes  Heaviness/pressure: no   Urinary History: Patient has no concerns  Frequent UTI: every 3-4 months usually, takes cranberry pills   Gastrointestinal History: Patient has no concerns  Sexual activity/pain:  Pain with intercourse: positive  Initial penetration: yes, with finger  External stimulation: no  Able to achieve orgasm: no  Location of pain: pelvic exam, initial penetration  Current pain:  0/10  Max pain:  8/10 Least pain:  0/10 Pain quality: "scratchy" feeling like a nail on L side   Patient assessment of present state: "It's like I'm being scratched"   Current activities:  Exercising, singing, drawing   Patient Goals:  Getting through a Pap smear and have a positive sexual experiences    TREATMENT  Pre-treatment assessment: Deferred on this date RANGE OF MOTION:  LEFT RIGHT  Lumbar forward flexion (65):      Lumbar extension (30):     Lumbar lateral flexion (25):     Thoracic and Lumbar rotation (30 degrees):       Hip Flexion (0-125):      Hip IR (0-45):     Hip ER (0-45):     Hip Abduction (0-40):     Hip extension (0-15):       SENSATION: Deferred on this date Grossly intact to light touch bilateral LEs as  determined by testing dermatomes L2-S2 Proprioception and hot/cold testing Deferred on this date  STRENGTH: MMT Deferred on this date  RLE LLE  Hip Flexion    Hip Extension    Hip Abduction     Hip Adduction     Hip ER     Hip IR     Knee Extension    Knee Flexion    Dorsiflexion     Plantarflexion (seated)     ABDOMINAL: Deferred on this date  SPECIAL TESTS: Deferred on this date Slump (SN 83, -LR 0.32): R: negative positive L: negative positive  SLR (SN 92, -LR 0.29): R: negative positive L: negative positive  FABER (SN 81): R: negative positive L: negative positive FADIR (SN 94): R: negative positive L: negative positive  Distraction (ZW25): R: negative positive L: negative positive  Compression (SN/SP 69): R: negative positive L: negative positive  Stork/March (SP 93): R: negative positive L: negative positive  EXTERNAL PELVIC EXAM: Deferred on this date Palpation: Breath coordination: present/absent/inconsistent Voluntary Contraction: present/absent Relaxation: full/delayed/non-relaxing Perineal movement with sustained IAP increase ("bear down"): descent/no change/elevation/excessive descent Perineal movement with rapid IAP increase ("cough"): elevation/no change/descent  INTERNAL VAGINAL EXAM: Deferred on this date Introitus Appears:  Skin integrity:  Scar mobility: Strength (PERF):  Symmetry: Palpation: Prolapse:  ASSESSMENT Patient is a 30 year old presenting to clinic with chief complaints of vaginal pain with any type of penetration. Today's evaluation suggests deficits in PFM coordination, PFM extensibility, pain, and posture as evidenced by inability to participate in well-woman pelvic exam secondary to 8/10 pain, L "scratching" sensation with digital penetration, increased thoracic kyphosis in sitting, and increased B hip adduction in sitting (potential source of increased tension at pelvic floor). Patient's responses on FOTO-PFDI Pain outcome measure (17)  indicate increased functional limitations/disability/distress. Patient's progress may be limited due to persistence of complaint; however, patient's motivation and curiosity are advantageous. Patient was able to achieve basic understanding of PFM function in bowel/bladder, sexual function, breathing mechanics, and posture during today's evaluation and responded positively to educational interventions. Patient will benefit from continued skilled physical therapy to address deficits in PFM coordination, PFM extensibility, nervous system upregulation, pain, and posture in order to increase function, and improve overall QOL.  EDUCATION Patient educated on what to expect during course of physical therapy, POC, pain science and provided with self-exploration handout to enhance knowledge of her pelvic floor. Patient verbalized understanding and returned demonstration. Patient will benefit from further education in order to maximize compliance and understanding to achieve established long-term goals.        [facility] PT Assessment - 05/24/21 1541       Assessment   Medical Diagnosis Vaginismus    Referring Provider (PT) Schermerhorn, Maisie Fus    Hand Dominance Right      Balance Screen   Has the patient fallen in the past 6 months No              Objective measurements completed  on examination: See above findings.        PT Long Term Goals - 05/24/21 1604       PT LONG TERM GOAL #1   Title Patient will demonstrate independent and coordinated diaphragmatic breathing in supine with a 1:2 breathing pattern for improved down-regulation of the nervous system and improved management of intra-abdominal pressures in order to increase function at home and in the community.    Baseline IE: not initiated    Time 12    Period Weeks    Status New    Target Date 08/16/21      PT LONG TERM GOAL #2   Title Patient will decrease worst pain as reported on NPRS by at least 2 points to demonstrate  clinically significant reduction in pain in order to participate in a pelvic exam, penetrative sex, and improve overall QOL.    Baseline IE: 8/10    Time 12    Period Weeks    Status New    Target Date 08/16/21      PT LONG TERM GOAL #3   Title Patient will be able to a score </= 4 on the FOTO-PFDI Pain outcome measure in order to demonstrate a clinically significant change and improve overall QOL.    Baseline IE: 17    Time 12    Period Weeks    Status New    Target Date 08/16/21      PT LONG TERM GOAL #4   Title Patient will demonstrate understanding of basic self-management/down-regulation of the nervous system for persistent pain condition and stress as evidenced by diaphragmatic breathing without cueing, body scan/progressive relaxation meditation, and improved sleep hygiene in order to transition to independent management of patient's chief complaint:pain and inability to participate in well-woman pelvic exam.    Baseline IE: not demonstrated    Time 12    Period Weeks    Status New    Target Date 08/16/21                 Plan - 05/24/21 1518     Clinical Impression Statement Patient is a 30 year old presenting to clinic with chief complaints of vaginal pain with any type of penetration. Today's evaluation suggests deficits in PFM coordination, PFM extensibility, pain, and posture as evidenced by inability to participate in well-woman pelvic exam secondary to 8/10 pain, L "scratching" sensation with digital penetration, increased thoracic kyphosis in sitting, and increased B hip adduction in sitting (potential source of increased tension at pelvic floor). Patient's responses on FOTO-PFDI Pain outcome measure (17) indicate increased functional limitations/disability/distress. Patient's progress may be limited due to persistence of complaint; however, patient's motivation and curiosity are advantageous. Patient was able to achieve basic understanding of PFM function in  bowel/bladder, sexual function, breathing mechanics, and posture during today's evaluation and responded positively to educational interventions. Patient will benefit from continued skilled physical therapy to address deficits in PFM coordination, PFM extensibility, nervous system upregulation, pain, and posture in order to increase function, and improve overall QOL.    Personal Factors and Comorbidities Education;Behavior Pattern;Comorbidity 1;Past/Current Experience    Comorbidities hx of kidney stones, left nephrolithiasis    Examination-Activity Limitations Other    Examination-Participation Restrictions Interpersonal Relationship;Other    Stability/Clinical Decision Making Evolving/Moderate complexity    Clinical Decision Making Moderate    Rehab Potential Good    PT Frequency 1x / week    PT Duration 12 weeks    PT Treatment/Interventions ADLs/Self Care Home  Management;Cryotherapy;Moist Heat;Functional mobility training;Therapeutic activities;Therapeutic exercise;Neuromuscular re-education;Patient/family education;Manual techniques    PT Next Visit Plan physical assessment    PT Home Exercise Plan self-exploration handout    Consulted and Agree with Plan of Care Patient             Patient will benefit from skilled therapeutic intervention in order to improve the following deficits and impairments:  Decreased mobility, Decreased endurance, Increased muscle spasms, Improper body mechanics, Decreased activity tolerance, Decreased coordination, Decreased strength, Postural dysfunction, Pain  Visit Diagnosis: Other lack of coordination  Pelvic pain  Pelvic floor tension     Problem List There are no problems to display for this patient.  Kathleen Kelley, SPT This entire session was performed under direct supervision and direction of a licensed Estate agenttherapist/therapist assistant . I have personally read, edited and approve of the note as written.   Sheria LangKatlin Harker PT, DPT [phone]#[id number]   05/24/2021, 4:51 PM  [facility] [address] [place], [place], [address] Phone: [phone]   Fax:  [phone]  Name: Kathleen Kelley MRN: [id number] Date of Birth: [birth year]

## 2021-06-01 ENCOUNTER — Encounter: Payer: Self-pay | Admitting: Physical Therapy

## 2021-06-01 ENCOUNTER — Ambulatory Visit: Payer: BC Managed Care – PPO | Attending: Obstetrics and Gynecology | Admitting: Physical Therapy

## 2021-06-01 ENCOUNTER — Other Ambulatory Visit: Payer: Self-pay

## 2021-06-01 DIAGNOSIS — M6289 Other specified disorders of muscle: Secondary | ICD-10-CM | POA: Diagnosis present

## 2021-06-01 DIAGNOSIS — R102 Pelvic and perineal pain: Secondary | ICD-10-CM | POA: Diagnosis present

## 2021-06-01 DIAGNOSIS — R278 Other lack of coordination: Secondary | ICD-10-CM | POA: Diagnosis not present

## 2021-06-01 NOTE — Therapy (Signed)
[place] [facility] [address] [place], [place], [address] Phone: [phone]   Fax:  [phone]  Physical Therapy Treatment  Patient Details  Name: Kathleen Kelley MRN: [id number] Date of Birth: 1990-11-29 Referring Provider (PT): Schermerhorn, Maisie Fus   Encounter Date: 06/01/2021   PT End of Session - 06/01/21 1409     Visit Number 2    Number of Visits 12    Date for PT Re-Evaluation 08/16/21    Authorization Type IE: 05/24/2021    PT Start Time 1415    PT Stop Time 1455    PT Time Calculation (min) 40 min    Activity Tolerance Patient tolerated treatment well    Behavior During Therapy [facility] for tasks assessed/performed             History reviewed. No pertinent past medical history.  History reviewed. No pertinent surgical history.  There were no vitals filed for this visit.   Subjective Assessment - 06/01/21 1408     Subjective Patient has no changes from initial evaluation. Patient also reports sensation of UTI being gone by taking cranberry tablets.    Currently in Pain? No/denies             TREATMENT  Pre-treatment assessment:  RANGE OF MOTION:    LEFT RIGHT  Lumbar forward flexion (65):  WNL    Lumbar extension (30): WNL    Lumbar lateral flexion (25):  WNL WNL  Thoracic and Lumbar rotation (30 degrees):    WNL WNL   Hip Flexion (0-125):   WNL WNL  Hip IR (0-45):  WNL WNL  Hip ER (0-45):  WNL WNL    SENSATION: Grossly intact to light touch bilateral LEs as determined by testing dermatomes L2-S2  STRENGTH: MMT   RLE LLE  Hip Flexion 5 5  Hip Extension 5 5  Hip Abduction  4 4  Hip Adduction  4 4  Hip ER  5 5  Hip IR  5 5  Knee Extension 5 5  Knee Flexion 5 5  Dorsiflexion  5 5  Plantarflexion (seated) 5 5   ABDOMINAL:  Diastasis: 2 fingers above, at, and below umbilicus  Rib flare: present  SPECIAL TESTS:  SLR (SN 92, -LR 0.29): R: negative L: negative  FABER (SN 81): R: negative  L:  negative FADDIR (SN 94): R: positive L: positive  EXTERNAL PELVIC EXAM:  Breath coordination: present inconsistently   Voluntary Contraction: absent, patient inhales/holds breath when attempting to perform  Relaxation: non-relaxing as contraction is absent, PFM able to return to resting position after cue to "bear down" with increased speed Perineal movement with sustained IAP increase ("bear down"): descent  Perineal movement with rapid IAP increase ("cough"): no change   Neuromuscular Re-education: Supine hooklying diaphragmatic breathing with VCs and TCs for nervous system downregulation and to improve IAP management.   Supine adductor (butterfly) stretch with coordinated breath, with VCs and TCs for improved PFM lengthening    Patient educated throughout session on appropriate technique and form using multi-modal cueing, HEP, and activity modification. Patient articulated understanding and returned demonstration.   ASSESSMENT Patient presents to clinic with excellent motivation to participate in today's session. Upon examination, patient demonstrates deficits in PFM coordination, PFM extensibility, breathing mechanics, hip musculature weakness, and pain as evidenced by a B (+) FADDIR, MMT 4/5 with B hip abduction/adduction in seated posture, B rib flare, inconsistent breath coordination with PFM and absent PFM contraction with increased breath holding. Patient  required increased time in between performing PFM functions during the external pelvic exam in order to return to breath coordination. PFM fasciculations present at the beginning of the external exam but with moderate cueing to focus on breath, muscle twitching ceased. Patient was able to achieve basic understanding of PFM function in breathing mechanics and lengthening to release tension during today's session and responded positively to active and educational interventions. Patient will benefit from continued skilled physical therapy  to address deficits in PFM coordination, PFM extensibility, nervous system downregulation, pain, and posture in order to increase function, and improve overall QOL.      PT Long Term Goals - 05/24/21 1604       PT LONG TERM GOAL #1   Title Patient will demonstrate independent and coordinated diaphragmatic breathing in supine with a 1:2 breathing pattern for improved down-regulation of the nervous system and improved management of intra-abdominal pressures in order to increase function at home and in the community.    Baseline IE: not initiated    Time 12    Period Weeks    Status New    Target Date 08/16/21      PT LONG TERM GOAL #2   Title Patient will decrease worst pain as reported on NPRS by at least 2 points to demonstrate clinically significant reduction in pain in order to participate in a pelvic exam, penetrative sex, and improve overall QOL.    Baseline IE: 8/10    Time 12    Period Weeks    Status New    Target Date 08/16/21      PT LONG TERM GOAL #3   Title Patient will be able to a score </= 4 on the FOTO-PFDI Pain outcome measure in order to demonstrate a clinically significant change and improve overall QOL.    Baseline IE: 17    Time 12    Period Weeks    Status New    Target Date 08/16/21      PT LONG TERM GOAL #4   Title Patient will demonstrate understanding of basic self-management/down-regulation of the nervous system for persistent pain condition and stress as evidenced by diaphragmatic breathing without cueing, body scan/progressive relaxation meditation, and improved sleep hygiene in order to transition to independent management of patient's chief complaint:pain and inability to participate in well-woman pelvic exam.    Baseline IE: not demonstrated    Time 12    Period Weeks    Status New    Target Date 08/16/21                   Plan - 06/01/21 1409     Clinical Impression Statement Patient presents to clinic with excellent motivation to  participate in today's session. Upon examination, patient demonstrates deficits in PFM coordination, PFM extensibility, breathing mechanics, hip musculature weakness, and pain as evidenced by a B (+) FADDIR, MMT 4/5 with B hip abduction/adduction in seated posture, B rib flare, inconsistent breath coordination with PFM and absent PFM contraction with increased breath holding. Patient required increased time in between performing PFM functions during the external pelvic exam in order to return to breath coordination. PFM fasciculations present at the beginning of the external exam but with moderate cueing to focus on breath, muscle twitching ceased. Patient was able to achieve basic understanding of PFM function in breathing mechanics and lengthening to release tension during today's session and responded positively to active and educational interventions. Patient will benefit from continued skilled physical therapy to  address deficits in PFM coordination, PFM extensibility, nervous system downregulation, pain, and posture in order to increase function, and improve overall QOL.    Personal Factors and Comorbidities Education;Behavior Pattern;Comorbidity 1;Past/Current Experience    Comorbidities hx of kidney stones, left nephrolithiasis    Examination-Activity Limitations Other    Examination-Participation Restrictions Interpersonal Relationship;Other    Stability/Clinical Decision Making Evolving/Moderate complexity    Clinical Decision Making Moderate    Rehab Potential Good    PT Frequency 1x / week    PT Duration 12 weeks    PT Treatment/Interventions ADLs/Self Care Home Management;Cryotherapy;Moist Heat;Functional mobility training;Therapeutic activities;Therapeutic exercise;Neuromuscular re-education;Patient/family education;Manual techniques    PT Next Visit Plan PFM downtraining, hip IR stretches, posture    PT Home Exercise Plan [id number]    Consulted and Agree with Plan of Care Patient              Patient will benefit from skilled therapeutic intervention in order to improve the following deficits and impairments:  Decreased mobility, Decreased endurance, Increased muscle spasms, Improper body mechanics, Decreased activity tolerance, Decreased coordination, Decreased strength, Postural dysfunction, Pain  Visit Diagnosis: Other lack of coordination  Pelvic pain  Pelvic floor tension     Problem List There are no problems to display for this patient.   Zinnia Tindall, SPT This entire session was performed under direct supervision and direction of a licensed Estate agent . I have personally read, edited and approve of the note as written.   Sheria Lang PT, DPT [phone]  06/01/2021, 4:13 PM  [place] [facility] [address] [place], [place], [address] Phone: [phone]   Fax:  [phone]  Name: Kathleen Kelley MRN: [id number] Date of Birth: Sep 08, 1991

## 2021-06-07 ENCOUNTER — Ambulatory Visit: Payer: BC Managed Care – PPO | Admitting: Physical Therapy

## 2021-06-07 ENCOUNTER — Encounter: Payer: Self-pay | Admitting: Physical Therapy

## 2021-06-07 ENCOUNTER — Other Ambulatory Visit: Payer: Self-pay

## 2021-06-07 DIAGNOSIS — R278 Other lack of coordination: Secondary | ICD-10-CM

## 2021-06-07 DIAGNOSIS — R102 Pelvic and perineal pain: Secondary | ICD-10-CM

## 2021-06-07 DIAGNOSIS — M6289 Other specified disorders of muscle: Secondary | ICD-10-CM

## 2021-06-07 NOTE — Therapy (Signed)
[place] [facility] [address] [place], [place], [address] Phone: [phone]   Fax:  [phone]  Physical Therapy Treatment  Patient Details  Name: Kathleen Kelley MRN: [id number] Date of Birth: 13-Jul-1991 Referring Provider (PT): Schermerhorn, Maisie Fus   Encounter Date: 06/07/2021   PT End of Session - 06/07/21 1416     Visit Number 3    Number of Visits 12    Date for PT Re-Evaluation 08/16/21    Authorization Type IE: 05/24/2021    PT Start Time 1417    PT Stop Time 1457    PT Time Calculation (min) 40 min    Activity Tolerance Patient tolerated treatment well    Behavior During Therapy [facility] for tasks assessed/performed             History reviewed. No pertinent past medical history.  History reviewed. No pertinent surgical history.  There were no vitals filed for this visit.   Subjective Assessment - 06/07/21 1416     Subjective Patient reports no changes from last session. Patient also reports some spotting and lower abdominal cramping as she just started her menstrual cycle.    Currently in Pain? Yes    Pain Score 3     Pain Location Abdomen    Pain Orientation Lower    Pain Descriptors / Indicators Cramping            TREATMENT  Neuromuscular Re-education: Supine hooklying diaphragmatic breathing with VCs and TCs for nervous system downregulation and to improve PFM coordination  PFM lengthening interventions with VCs and TCs as needed  Supine adductor (butterfly) stretch with coordinated breath Supine "happy baby" with coordinated breath  "Childs pose" with coordinated breath  "Childs pose" and R lateral stretch to improve L musculature lengthening, with coordinated breath Prone extension with coordinated breath  Patient education on body mechanics for lifting at work to decrease hip pain/improve movement efficiency. Further education needed.  Patient educated throughout session on appropriate technique  and form using multi-modal cueing, HEP, and activity modification. Patient articulated understanding and returned demonstration.  Patient Response to Intervention: Patient notes increased lateral hip mm soreness after interventions and increased discomfort on the anterolateral aspect of hip with increased adductor stretch in childs pose.   ASSESSMENT Patient presents to clinic with excellent motivation to participate in today's session. Patient continues to demonstrate deficits in PFM coordination, PFM lengthening, posture, and adductor tone (non-neurological). Patient performed a variety of PFM lengthening interventions with coordinated breath in order to improve extensibility. Patient observed to have a pelvic obliquity in "childs pose" and encouraged to perform L lateral musculature lengthening with intervention. Patient also observed to have increased length of B femurs compared to B tibias in varying positions. Patient was able to achieve understanding of adductor musculature at the pelvis, the tension they can carry, and as a result create tension at the PFM and discomfort at the hip joint. Patient responded positively to active and educational interventions. Patient will benefit from continued skilled physical therapy to address deficits in PFM coordination, PFM extensibility, nervous system downregulation, pain, and posture in order to increase function, and improve overall QOL.       PT Long Term Goals - 05/24/21 1604       PT LONG TERM GOAL #1   Title Patient will demonstrate independent and coordinated diaphragmatic breathing in supine with a 1:2 breathing pattern for improved down-regulation of the nervous system and improved management of intra-abdominal pressures in order to  increase function at home and in the community.    Baseline IE: not initiated    Time 12    Period Weeks    Status New    Target Date 08/16/21      PT LONG TERM GOAL #2   Title Patient will decrease worst  pain as reported on NPRS by at least 2 points to demonstrate clinically significant reduction in pain in order to participate in a pelvic exam, penetrative sex, and improve overall QOL.    Baseline IE: 8/10    Time 12    Period Weeks    Status New    Target Date 08/16/21      PT LONG TERM GOAL #3   Title Patient will be able to a score </= 4 on the FOTO-PFDI Pain outcome measure in order to demonstrate a clinically significant change and improve overall QOL.    Baseline IE: 17    Time 12    Period Weeks    Status New    Target Date 08/16/21      PT LONG TERM GOAL #4   Title Patient will demonstrate understanding of basic self-management/down-regulation of the nervous system for persistent pain condition and stress as evidenced by diaphragmatic breathing without cueing, body scan/progressive relaxation meditation, and improved sleep hygiene in order to transition to independent management of patient's chief complaint:pain and inability to participate in well-woman pelvic exam.    Baseline IE: not demonstrated    Time 12    Period Weeks    Status New    Target Date 08/16/21                   Plan - 06/07/21 1418     Clinical Impression Statement Patient presents to clinic with excellent motivation to participate in today's session. Patient continues to demonstrate deficits in PFM coordination, PFM lengthening, posture, and adductor tone (non-neurological). Patient performed a variety of PFM lengthening interventions with coordinated breath in order to improve extensibility. Patient observed to have a pelvic obliquity in "childs pose" and encouraged to perform L lateral musculature lengthening with intervention. Patient also observed to have increased length of B femurs compared to B tibias in varying positions. Patient was able to achieve understanding of adductor musculature at the pelvis, the tension they can carry, and as a result create tension at the PFM and discomfort at the  hip joint. Patient responded positively to active and educational interventions. Patient will benefit from continued skilled physical therapy to address deficits in PFM coordination, PFM extensibility, nervous system downregulation, pain, and posture in order to increase function, and improve overall QOL.    Personal Factors and Comorbidities Education;Behavior Pattern;Comorbidity 1;Past/Current Experience    Comorbidities hx of kidney stones, left nephrolithiasis    Examination-Activity Limitations Other    Examination-Participation Restrictions Interpersonal Relationship;Other    Stability/Clinical Decision Making Evolving/Moderate complexity    Rehab Potential Good    PT Frequency 1x / week    PT Duration 12 weeks    PT Treatment/Interventions ADLs/Self Care Home Management;Cryotherapy;Moist Heat;Functional mobility training;Therapeutic activities;Therapeutic exercise;Neuromuscular re-education;Patient/family education;Manual techniques    PT Next Visit Plan PFM downtraining, lifting mechanics, posture, TrA    PT Home Exercise Plan [id number]    Consulted and Agree with Plan of Care Patient             Patient will benefit from skilled therapeutic intervention in order to improve the following deficits and impairments:  Decreased mobility, Decreased endurance, Increased muscle spasms,  Improper body mechanics, Decreased activity tolerance, Decreased coordination, Decreased strength, Postural dysfunction, Pain  Visit Diagnosis: Other lack of coordination  Pelvic pain  Pelvic floor tension     Problem List There are no problems to display for this patient.   Trinna Kunst, SPT This entire session was performed under direct supervision and direction of a licensed Estate agent . I have personally read, edited and approve of the note as written.  Sheria Lang PT, DPT [phone]  06/07/2021, 3:28 PM  [place] [facility] [address] [place], [place], [address] Phone: [phone]   Fax:  [phone]  Name: Heli Dino MRN: [id number] Date of Birth: 15-Nov-1990

## 2021-06-14 ENCOUNTER — Ambulatory Visit: Payer: BC Managed Care – PPO | Admitting: Physical Therapy

## 2021-06-14 ENCOUNTER — Other Ambulatory Visit: Payer: Self-pay

## 2021-06-14 ENCOUNTER — Encounter: Payer: Self-pay | Admitting: Physical Therapy

## 2021-06-14 DIAGNOSIS — R278 Other lack of coordination: Secondary | ICD-10-CM | POA: Diagnosis not present

## 2021-06-14 DIAGNOSIS — M6289 Other specified disorders of muscle: Secondary | ICD-10-CM

## 2021-06-14 DIAGNOSIS — R102 Pelvic and perineal pain: Secondary | ICD-10-CM

## 2021-06-14 NOTE — Therapy (Signed)
[place] [facility] [address] [place], [place], [address] Phone: [phone]   Fax:  [phone]  Physical Therapy Treatment  Patient Details  Name: Kathleen Kelley MRN: [id number] Date of Birth: 01-15-1991 Referring Provider (PT): Schermerhorn, Maisie Fus   Encounter Date: 06/14/2021   PT End of Session - 06/14/21 1404     Visit Number 4    Number of Visits 12    Date for PT Re-Evaluation 08/16/21    Authorization Type IE: 05/24/2021    PT Start Time 1405    PT Stop Time 1445    PT Time Calculation (min) 40 min    Activity Tolerance Patient tolerated treatment well    Behavior During Therapy [facility] for tasks assessed/performed             History reviewed. No pertinent past medical history.  History reviewed. No pertinent surgical history.  There were no vitals filed for this visit.   Subjective Assessment - 06/14/21 1404     Subjective Patient reports intermittent pain in L hip with "childs pose" from HEP and while walking. Patient states she has experienced this pain before. Patient denies numbness and tingling.    Currently in Pain? No/denies             TREATMENT   Neuromuscular Re-education: Positional variation for movement efficiency, postural stability and pain modulation in L hip  Half kneeling-to-stand B LE with 7 lb weighted box, VCs and TCs for wider lunge  10 lbs added, verbal cues to maintain weight closer to body     RDL/hip hinge at the wall for feedback, VCs and TCs as needed   Golfers lift with weighted box, VCs and TCs as needed   Patient educated throughout session on appropriate technique and form using multi-modal cueing, HEP, and activity modification. Patient articulated understanding and returned demonstration.   Patient Response to Intervention: Patient reports feeling her muscles working during [employer] and was comfortable to add them to HEP   ASSESSMENT Patient presents to clinic with  excellent motivation to participate in today's session. Patient continues to demonstrate deficits in PFM coordination, PFM lengthening, posture, pain, and adductor tone (non-neurological). Patient performed a variety of lifting techniques that would be most beneficial to decrease L hip discomfort and aid in postural stability during work tasks. Patient required moderate cueing with half-kneeling to stand and hip hinge at the wall for decreased joint hyperextension, decreased knee pain, and other observed compensatory mechanisms. Patient was able to achieve basic understanding of body mechanics during transitional lifting movements and general hip strengthening to aid in offloading the PFM. Patient will benefit from continued skilled physical therapy to address deficits in PFM coordination, PFM extensibility, nervous system downregulation, pain, and posture in order to increase function, and improve overall QOL.        PT Long Term Goals - 05/24/21 1604       PT LONG TERM GOAL #1   Title Patient will demonstrate independent and coordinated diaphragmatic breathing in supine with a 1:2 breathing pattern for improved down-regulation of the nervous system and improved management of intra-abdominal pressures in order to increase function at home and in the community.    Baseline IE: not initiated    Time 12    Period Weeks    Status New    Target Date 08/16/21      PT LONG TERM GOAL #2   Title Patient will decrease worst pain as reported on NPRS by at  least 2 points to demonstrate clinically significant reduction in pain in order to participate in a pelvic exam, penetrative sex, and improve overall QOL.    Baseline IE: 8/10    Time 12    Period Weeks    Status New    Target Date 08/16/21      PT LONG TERM GOAL #3   Title Patient will be able to a score </= 4 on the FOTO-PFDI Pain outcome measure in order to demonstrate a clinically significant change and improve overall QOL.    Baseline IE: 17     Time 12    Period Weeks    Status New    Target Date 08/16/21      PT LONG TERM GOAL #4   Title Patient will demonstrate understanding of basic self-management/down-regulation of the nervous system for persistent pain condition and stress as evidenced by diaphragmatic breathing without cueing, body scan/progressive relaxation meditation, and improved sleep hygiene in order to transition to independent management of patient's chief complaint:pain and inability to participate in well-woman pelvic exam.    Baseline IE: not demonstrated    Time 12    Period Weeks    Status New    Target Date 08/16/21                   Plan - 06/14/21 1405     Clinical Impression Statement Patient presents to clinic with excellent motivation to participate in today's session. Patient continues to demonstrate deficits in PFM coordination, PFM lengthening, posture, pain, and adductor tone (non-neurological). Patient performed a variety of lifting techniques that would be most beneficial to decrease L hip discomfort and aid in postural stability during work tasks. Patient required moderate cueing with kneel-to-stands and RDLs at the wall for decreased joint locking, decreased knee pain, and other observed body compensations. Patient was able to achieve basic understanding of body mechanics during transitional lifting movements and general hip strengthening to aid in offloading the PFM. Patient will benefit from continued skilled physical therapy to address deficits in PFM coordination, PFM extensibility, nervous system downregulation, pain, and posture in order to increase function, and improve overall QOL.    Personal Factors and Comorbidities Education;Behavior Pattern;Comorbidity 1;Past/Current Experience    Comorbidities hx of kidney stones, left nephrolithiasis    Examination-Activity Limitations Other    Examination-Participation Restrictions Interpersonal Relationship;Other    Stability/Clinical  Decision Making Evolving/Moderate complexity    Rehab Potential Good    PT Frequency 1x / week    PT Duration 12 weeks    PT Treatment/Interventions ADLs/Self Care Home Management;Cryotherapy;Moist Heat;Functional mobility training;Therapeutic activities;Therapeutic exercise;Neuromuscular re-education;Patient/family education;Manual techniques    PT Next Visit Plan LE strength, RDLs, posture, continue PFM downtraining    PT Home Exercise Plan [id number]    Consulted and Agree with Plan of Care Patient             Patient will benefit from skilled therapeutic intervention in order to improve the following deficits and impairments:  Decreased mobility, Decreased endurance, Increased muscle spasms, Improper body mechanics, Decreased activity tolerance, Decreased coordination, Decreased strength, Postural dysfunction, Pain  Visit Diagnosis: Other lack of coordination  Pelvic pain  Pelvic floor tension     Problem List There are no problems to display for this patient.   Hassen Bruun, SPT  This entire session was performed under direct supervision and direction of a licensed Estate agent . I have personally read, edited and approve of the note as written.  Sheria Lang  PT, DPT [phone]  06/14/2021, 3:12 PM  [facility] [address] [place], [place], [address] Phone: [phone]   Fax:  [phone]  Name: Kathleen Kelley MRN: [id number] Date of Birth: 08/01/1991

## 2021-06-21 ENCOUNTER — Encounter: Payer: Self-pay | Admitting: Physical Therapy

## 2021-06-21 ENCOUNTER — Ambulatory Visit: Payer: BC Managed Care – PPO | Admitting: Physical Therapy

## 2021-06-21 ENCOUNTER — Other Ambulatory Visit: Payer: Self-pay

## 2021-06-21 DIAGNOSIS — R278 Other lack of coordination: Secondary | ICD-10-CM | POA: Diagnosis not present

## 2021-06-21 DIAGNOSIS — M6289 Other specified disorders of muscle: Secondary | ICD-10-CM

## 2021-06-21 DIAGNOSIS — R102 Pelvic and perineal pain unspecified side: Secondary | ICD-10-CM

## 2021-06-21 NOTE — Therapy (Signed)
[facility] [address] [place], [place], [address] Phone: [phone]   Fax:  [phone]  Physical Therapy Treatment  Patient Details  Name: Kathleen Kelley MRN: [id number] Date of Birth: 1990/10/02 Referring Provider (PT): Schermerhorn, Maisie Fus   Encounter Date: 06/21/2021   PT End of Session - 06/21/21 1411     Visit Number 5    Number of Visits 12    Date for PT Re-Evaluation 08/16/21    Authorization Type IE: 05/24/2021    PT Start Time 1415    PT Stop Time 1500    PT Time Calculation (min) 45 min    Activity Tolerance Patient tolerated treatment well    Behavior During Therapy [facility] for tasks assessed/performed             History reviewed. No pertinent past medical history.  History reviewed. No pertinent surgical history.  There were no vitals filed for this visit.   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 Patient able to use body mechanics discussed last session and states they have helped at her jobs. Patient describes carrying a bag of pet food over her shoulder and felt an increase in pressure around the pelvis but not the vaginal opening.    Pain Score 0-No pain             TREATMENT   Neuromuscular Re-education: Supine hooklying diaphragmatic breathing with VCs and TCs for downregulation of nervous system and improve IAP management.   Supine PFM lengthening activities with coordinated breath and visualization of Level 1 dilator, VCs and TCs as needed              Supine single knee to chest              Supine "happy baby" pose   Patient educated on beginning a dilator program in order to downregulate the nervous system before initial insertion at the vaginal opening. PT took time to demonstrate different sizes/materials of dilators and the gradual progression of training. Patient articulated understanding.  Patient also educated on vulva anatomy, desensitizing the vulva with use of self-exploration and diaphragmatic breathing.    Patient Response to Intervention: Patient reports she will try using the smallest size of her dilator this week for HEP.    ASSESSMENT Patient presents to clinic with excellent motivation to participate in today's session. Patient continues to demonstrate deficits in PFM coordination, PFM lengthening, posture, pain, and adductor tone (non-neurological). Patient performed multiple PFM lengthening techniques with graded exposure (visualization) to a Level 1 dilator (Intimate Rose) and diaphragmatic breathing in order to downregulate the nervous system. Patient was able to achieved basic understanding of dilator progression, importance of self-exploration, vulva anatomy, and responded well to active and educational interventions. Patient will benefit from continued skilled physical therapy to address deficits in PFM coordination, PFM extensibility, nervous system downregulation, pain, afnd posture in order to increase function, and improve overall QOL.         PT Long Term Goals - 05/24/21 1604       PT LONG TERM GOAL #1   Title Patient will demonstrate independent and coordinated diaphragmatic breathing in supine with a 1:2 breathing pattern for improved down-regulation of the nervous system and improved management of intra-abdominal pressures in order to increase function at home and in the community.    Baseline IE: not initiated  Time 12    Period Weeks    Status New    Target Date 08/16/21      PT LONG TERM GOAL #2   Title Patient will decrease worst pain as reported on NPRS by at least 2 points to demonstrate clinically significant reduction in pain in order to participate in a pelvic exam, penetrative sex, and improve overall QOL.    Baseline IE: 8/10    Time 12    Period Weeks    Status New    Target Date 08/16/21      PT LONG TERM GOAL #3   Title  Patient will be able to a score </= 4 on the FOTO-PFDI Pain outcome measure in order to demonstrate a clinically significant change and improve overall QOL.    Baseline IE: 17    Time 12    Period Weeks    Status New    Target Date 08/16/21      PT LONG TERM GOAL #4   Title Patient will demonstrate understanding of basic self-management/down-regulation of the nervous system for persistent pain condition and stress as evidenced by diaphragmatic breathing without cueing, body scan/progressive relaxation meditation, and improved sleep hygiene in order to transition to independent management of patient's chief complaint:pain and inability to participate in well-woman pelvic exam.    Baseline IE: not demonstrated    Time 12    Period Weeks    Status New    Target Date 08/16/21                   Plan - 06/21/21 1412     Clinical Impression Statement Patient presents to clinic with excellent motivation to participate in today's session. Patient continues to demonstrate deficits in PFM coordination, PFM lengthening, posture, pain, and adductor tone (non-neurological). Patient performed multiple PFM lengthening techniques with visual/touch of a Level 1 dilator (Intimate Rose) and diaphragmatic breathing in order to downregulate the nervous system. Patient was able to achieved basic understanding of dilator progression, importance of self-exploration, vulva anatomy, and responded well to active and educational interventions. Patient will benefit from continued skilled physical therapy to address deficits in PFM coordination, PFM extensibility, nervous system downregulation, pain, afnd posture in order to increase function, and improve overall QOL.    Personal Factors and Comorbidities Education;Behavior Pattern;Comorbidity 1;Past/Current Experience    Comorbidities hx of kidney stones, left nephrolithiasis    Examination-Activity Limitations Other    Examination-Participation Restrictions  Interpersonal Relationship;Other    Stability/Clinical Decision Making Evolving/Moderate complexity    Rehab Potential Good    PT Frequency 1x / week    PT Duration 12 weeks    PT Treatment/Interventions ADLs/Self Care Home Management;Cryotherapy;Moist Heat;Functional mobility training;Therapeutic activities;Therapeutic exercise;Neuromuscular re-education;Patient/family education;Manual techniques    PT Next Visit Plan LE strength, RDLs, posture, continue PFM downtraining    PT Home Exercise Plan dilator training    Consulted and Agree with Plan of Care Patient             Patient will benefit from skilled therapeutic intervention in order to improve the following deficits and impairments:  Decreased mobility, Decreased endurance, Increased muscle spasms, Improper body mechanics, Decreased activity tolerance, Decreased coordination, Decreased strength, Postural dysfunction, Pain  Visit Diagnosis: Other lack of coordination  Pelvic pain  Pelvic floor tension     Problem List There are no problems to display for this patient.  Leshon Armistead, SPT  This entire session was performed under direct supervision and direction of a licensed  therapist/therapist assistant . I have personally read, edited and approve of the note as written.  Sheria Lang PT, DPT [phone]  06/21/2021, 3:27 PM  [place] [facility] [address] [place], [place], [address] Phone: [phone]   Fax:  [phone]  Name: Mazi Schuff MRN: [id number] Date of Birth: March 15, 1991

## 2021-06-28 ENCOUNTER — Ambulatory Visit: Payer: BC Managed Care – PPO | Attending: Obstetrics and Gynecology | Admitting: Physical Therapy

## 2021-06-28 ENCOUNTER — Encounter: Payer: Self-pay | Admitting: Physical Therapy

## 2021-06-28 ENCOUNTER — Other Ambulatory Visit: Payer: Self-pay

## 2021-06-28 DIAGNOSIS — R102 Pelvic and perineal pain: Secondary | ICD-10-CM | POA: Diagnosis present

## 2021-06-28 DIAGNOSIS — M6289 Other specified disorders of muscle: Secondary | ICD-10-CM | POA: Insufficient documentation

## 2021-06-28 DIAGNOSIS — R278 Other lack of coordination: Secondary | ICD-10-CM | POA: Insufficient documentation

## 2021-06-28 NOTE — Therapy (Signed)
[facility] [address] [place], [place], [address] Phone: [phone]   Fax:  [phone]  Physical Therapy Treatment  Patient Details  Name: Kathleen Kelley MRN: [id number] Date of Birth: 14-Sep-1991 Referring Provider (PT): Schermerhorn, Maisie Fus   Encounter Date: 06/28/2021   PT End of Session - 06/28/21 1421     Visit Number 6    Number of Visits 12    Date for PT Re-Evaluation 08/16/21    Authorization Type IE: 05/24/2021    PT Start Time 1418    PT Stop Time 1457    PT Time Calculation (min) 39 min    Activity Tolerance Patient tolerated treatment well    Behavior During Therapy [facility] for tasks assessed/performed             History reviewed. No pertinent past medical history.  History reviewed. No pertinent surgical history.  There were no vitals filed for this visit.   Subjective Assessment - 06/28/21 1420     Subjective Patient was able to participate in self-exploration and tolerated at least a couple seconds with no pain. Patient states it was different but wanted to trial run to see if she could participate. Patient also participated in dilator training visually and diaphragmatic breathing. Patient stated not have any problem with the dilators.    Currently in Pain? No/denies            TREATMENT    Neuromuscular Re-education: Patient educated on progressing self-exploration as tolerated and introduced to what to expect during an individually tailored internal assessment to address chief concerns. Patient verbalized understanding and wanted to participate in other interventions today in order to have time to process information received. Patient also introduced to the pelvic wand and how angle of insertion of a finger or dilator is important due to the bony structures surrounding the PFM. Patient expressed understanding via visual demonstration on pelvic model.   Supine hooklying bridging with abduction,  BlueTB x15, with VCs and TCs for improved postural stability and strengthening of the posterior chain   Supine single leg bridging, B x10, with VCs and TCs for improved postural stability and strengthening of the posterior chain   Lateral sidestepping, BlueTB at ankle x1 lap in hallway, with VCs and TCs for improved postural stability, strengthening to offload tension at the PFM, and to decrease thoracic kyphosis compensations    Patient Response to Intervention: Patient reports she is comfortable continuing self-exploration training.    ASSESSMENT Patient presents to clinic with excellent motivation to participate in today's session. Patient continues to demonstrate deficits in PFM coordination, PFM lengthening, posture, pain, and adductor tone (non-neurological). Patient performed a variety of postural stability interventions with moderate cueing to decrease spinal compensations. Patient was able to achieve basic understanding of a pelvic floor internal assessment and the importance of positioning during self-exploration due to bony structures around the PFM. Patient responded well to active and educational interventions and will benefit from continued skilled physical therapy to address deficits in PFM coordination, PFM extensibility, nervous system downregulation, pain, afnd posture in order to increase function, and improve overall QOL.        PT Long Term Goals - 05/24/21 1604       PT LONG TERM GOAL #1   Title Patient will demonstrate independent and coordinated diaphragmatic breathing in supine with a 1:2 breathing pattern for improved down-regulation of the nervous system and improved management of intra-abdominal pressures in order to increase function at home  and in the community.    Baseline IE: not initiated    Time 12    Period Weeks    Status New    Target Date 08/16/21      PT LONG TERM GOAL #2   Title Patient will decrease worst pain as reported on NPRS by at least 2  points to demonstrate clinically significant reduction in pain in order to participate in a pelvic exam, penetrative sex, and improve overall QOL.    Baseline IE: 8/10    Time 12    Period Weeks    Status New    Target Date 08/16/21      PT LONG TERM GOAL #3   Title Patient will be able to a score </= 4 on the FOTO-PFDI Pain outcome measure in order to demonstrate a clinically significant change and improve overall QOL.    Baseline IE: 17    Time 12    Period Weeks    Status New    Target Date 08/16/21      PT LONG TERM GOAL #4   Title Patient will demonstrate understanding of basic self-management/down-regulation of the nervous system for persistent pain condition and stress as evidenced by diaphragmatic breathing without cueing, body scan/progressive relaxation meditation, and improved sleep hygiene in order to transition to independent management of patient's chief complaint:pain and inability to participate in well-woman pelvic exam.    Baseline IE: not demonstrated    Time 12    Period Weeks    Status New    Target Date 08/16/21                   Plan - 06/28/21 1554     Clinical Impression Statement Patient presents to clinic with excellent motivation to participate in today's session. Patient continues to demonstrate deficits in PFM coordination, PFM lengthening, posture, pain, and adductor tone (non-neurological). Patient performed a variety of postural stability interventions with moderate cueing to decrease spinal compensations. Patient was able to achieve basic understanding of a pelvic floor internal assessment and the importance of positioning during self-exploration due to bony structures around the PFM. Patient responded well to active and educational interventions and will benefit from continued skilled physical therapy to address deficits in PFM coordination, PFM extensibility, nervous system downregulation, pain, afnd posture in order to increase function, and  improve overall QOL.    Personal Factors and Comorbidities Education;Behavior Pattern;Comorbidity 1;Past/Current Experience    Comorbidities hx of kidney stones, left nephrolithiasis    Examination-Activity Limitations Other    Examination-Participation Restrictions Interpersonal Relationship;Other    Stability/Clinical Decision Making Evolving/Moderate complexity    Rehab Potential Good    PT Frequency 1x / week    PT Duration 12 weeks    PT Treatment/Interventions ADLs/Self Care Home Management;Cryotherapy;Moist Heat;Functional mobility training;Therapeutic activities;Therapeutic exercise;Neuromuscular re-education;Patient/family education;Manual techniques    PT Next Visit Plan LE strength, RDLs, posture, internal    PT Home Exercise Plan bridging with abd, single leg bridging, lateral sidestepping    Consulted and Agree with Plan of Care Patient             Patient will benefit from skilled therapeutic intervention in order to improve the following deficits and impairments:  Decreased mobility, Decreased endurance, Increased muscle spasms, Improper body mechanics, Decreased activity tolerance, Decreased coordination, Decreased strength, Postural dysfunction, Pain  Visit Diagnosis: Other lack of coordination  Pelvic pain  Pelvic floor tension     Problem List There are no problems to display for  this patient.   Ladd Cen, SPT  This entire session was performed under direct supervision and direction of a licensed Estate agent . I have personally read, edited and approve of the note as written.  Sheria Lang PT, DPT [phone]  06/28/2021, 3:55 PM  [facility] [address] [place], [place], [address] Phone: [phone]   Fax:  [phone]  Name: Kathleen Kelley MRN: [id number] Date of Birth: October 20, 1990

## 2021-07-05 ENCOUNTER — Encounter: Payer: Self-pay | Admitting: Physical Therapy

## 2021-07-05 ENCOUNTER — Ambulatory Visit: Payer: BC Managed Care – PPO | Admitting: Physical Therapy

## 2021-07-05 ENCOUNTER — Other Ambulatory Visit: Payer: Self-pay

## 2021-07-05 DIAGNOSIS — R102 Pelvic and perineal pain unspecified side: Secondary | ICD-10-CM

## 2021-07-05 DIAGNOSIS — R278 Other lack of coordination: Secondary | ICD-10-CM | POA: Diagnosis not present

## 2021-07-05 DIAGNOSIS — M6289 Other specified disorders of muscle: Secondary | ICD-10-CM

## 2021-07-05 NOTE — Therapy (Signed)
[facility] [address] [place], [place], [address] Phone: [phone]   Fax:  [phone]  Physical Therapy Treatment  Patient Details  Name: Kathleen Kelley MRN: [id number] Date of Birth: [birth year] Referring Provider (PT): Schermerhorn, Maisie Fus   Encounter Date: 07/05/2021   PT End of Session - 07/05/21 1408     Visit Number 7    Number of Visits 12    Date for PT Re-Evaluation 08/16/21    Authorization Type IE: 05/24/2021    PT Start Time 1410    PT Stop Time 1455    PT Time Calculation (min) 45 min    Activity Tolerance Patient tolerated treatment well    Behavior During Therapy [facility] for tasks assessed/performed             History reviewed. No pertinent past medical history.  History reviewed. No pertinent surgical history.  There were no vitals filed for this visit.   Subjective Assessment - 07/05/21 1408     Subjective Patient reveals having to quit her part-time job as it was affecting her sleep schedule, sleeping only 3 hours a day, as she works third shift for [employer]. Patient tried a different technique in regards to self-exploration and is still experiencing "hitting a wall." Patient had no pain with partial internal technique but would like to figure out why it feels like a wall.    Currently in Pain? No/denies             TREATMENT   Neuromuscular Re-education: Supine diaphragmatic breathing with VCs and TCs for downregulation of nervous system and improved IAP management   Manual Therapy: INTERNAL VAGINAL EXAM: Patient educated on the purpose of the pelvic exam and articulated understanding; patient consented to the exam verbally. Introitus Appears: WNL, non-gapping Skin integrity: WNL, no noted erythema or edema Scar mobility: n/a Symmetry: no significant asymmetries noted Palpation: no TTP externally or at posterior fourchette; tender to palpation internally at superficial layer, R>L.  STM  and TPR performed to puborectalis B to allow for decreased tension and pain and improved posture and function    Patient Response to Intervention: Patient responded well during pelvic floor internal exam and increased pain/pressure sites were resolved with diaphragmatic breathing. Patient agreed to continuing to work internally in order to reach long-term goals.    ASSESSMENT Patient presents to clinic with excellent motivation to participate in today's session. Patient continues to demonstrate deficits in PFM coordination, PFM lengthening, posture, pain, and adductor tone (non-neurological). Patient able to tolerate internal PFM assessment to the level of the puborectalis with pain managed using diaphragmatic breathing and visualization of exam by mirror. Patient responded well to manual interventions and will benefit from continued skilled physical therapy to address deficits in PFM coordination, PFM extensibility, nervous system downregulation, pain, afnd posture in order to increase function, and improve overall QOL.          PT Long Term Goals - 05/24/21 1604       PT LONG TERM GOAL #1   Title Patient will demonstrate independent and coordinated diaphragmatic breathing in supine with a 1:2 breathing pattern for improved down-regulation of the nervous system and improved management of intra-abdominal pressures in order to increase function at home and in the community.    Baseline IE: not initiated    Time 12    Period Weeks    Status New    Target Date 08/16/21      PT LONG TERM GOAL #2  Title Patient will decrease worst pain as reported on NPRS by at least 2 points to demonstrate clinically significant reduction in pain in order to participate in a pelvic exam, penetrative sex, and improve overall QOL.    Baseline IE: 8/10    Time 12    Period Weeks    Status New    Target Date 08/16/21      PT LONG TERM GOAL #3   Title Patient will be able to a score </= 4 on the  FOTO-PFDI Pain outcome measure in order to demonstrate a clinically significant change and improve overall QOL.    Baseline IE: 17    Time 12    Period Weeks    Status New    Target Date 08/16/21      PT LONG TERM GOAL #4   Title Patient will demonstrate understanding of basic self-management/down-regulation of the nervous system for persistent pain condition and stress as evidenced by diaphragmatic breathing without cueing, body scan/progressive relaxation meditation, and improved sleep hygiene in order to transition to independent management of patient's chief complaint:pain and inability to participate in well-woman pelvic exam.    Baseline IE: not demonstrated    Time 12    Period Weeks    Status New    Target Date 08/16/21                   Plan - 07/05/21 1409     Clinical Impression Statement Patient presents to clinic with excellent motivation to participate in today's session. Patient continues to demonstrate deficits in PFM coordination, PFM lengthening, posture, pain, and adductor tone (non-neurological). Patient able to tolerate internal PFM assessment to the level of the puborectalis with pain managed using diaphragmatic breathing and visualization of exam by mirror. Patient responded well to manual interventions and will benefit from continued skilled physical therapy to address deficits in PFM coordination, PFM extensibility, nervous system downregulation, pain, afnd posture in order to increase function, and improve overall QOL.    Personal Factors and Comorbidities Education;Behavior Pattern;Comorbidity 1;Past/Current Experience    Comorbidities hx of kidney stones, left nephrolithiasis    Examination-Activity Limitations Other    Examination-Participation Restrictions Interpersonal Relationship;Other    Stability/Clinical Decision Making Evolving/Moderate complexity    Rehab Potential Good    PT Frequency 1x / week    PT Duration 12 weeks    PT  Treatment/Interventions ADLs/Self Care Home Management;Cryotherapy;Moist Heat;Functional mobility training;Therapeutic activities;Therapeutic exercise;Neuromuscular re-education;Patient/family education;Manual techniques    PT Next Visit Plan LE strength, RDLs, posture, internal    PT Home Exercise Plan bridging with abd, single leg bridging, lateral sidestepping    Consulted and Agree with Plan of Care Patient             Patient will benefit from skilled therapeutic intervention in order to improve the following deficits and impairments:  Decreased mobility, Decreased endurance, Increased muscle spasms, Improper body mechanics, Decreased activity tolerance, Decreased coordination, Decreased strength, Postural dysfunction, Pain  Visit Diagnosis: Other lack of coordination  Pelvic pain  Pelvic floor tension     Problem List There are no problems to display for this patient.  Meiko Stranahan, SPT  This entire session was performed under direct supervision and direction of a licensed Estate agent. I have personally read, edited and approve of the note as written.   Sheria Lang PT, DPT [phone]  07/05/2021, 4:57 PM  [place] [facility] [address] [place], [place], [address] Phone: [phone]  Fax:  [phone]  Name: Kathleen Kelley MRN: [id number] Date of Birth: [birth year]

## 2021-07-12 ENCOUNTER — Encounter: Payer: BC Managed Care – PPO | Admitting: Physical Therapy

## 2021-07-14 ENCOUNTER — Encounter: Payer: Self-pay | Admitting: Physical Therapy

## 2021-07-14 ENCOUNTER — Ambulatory Visit: Payer: BC Managed Care – PPO | Admitting: Physical Therapy

## 2021-07-14 ENCOUNTER — Other Ambulatory Visit: Payer: Self-pay

## 2021-07-14 DIAGNOSIS — M6289 Other specified disorders of muscle: Secondary | ICD-10-CM

## 2021-07-14 DIAGNOSIS — R278 Other lack of coordination: Secondary | ICD-10-CM

## 2021-07-14 DIAGNOSIS — R102 Pelvic and perineal pain: Secondary | ICD-10-CM

## 2021-07-15 NOTE — Therapy (Signed)
[facility] [address] [place], [place], [address] Phone: [phone]   Fax:  [phone]  Physical Therapy Treatment  Patient Details  Name: Kathleen Kelley MRN: [id number] Date of Birth: Nov 11, 1990 Referring Provider (PT): Schermerhorn, Maisie Fus   Encounter Date: 07/14/2021   PT End of Session - 07/14/21 1405     Visit Number 8    Number of Visits 12    Date for PT Re-Evaluation 08/16/21    Authorization Type IE: 05/24/2021    PT Start Time 1415    PT Stop Time 1500    PT Time Calculation (min) 45 min    Activity Tolerance Patient tolerated treatment well    Behavior During Therapy [facility] for tasks assessed/performed             History reviewed. No pertinent past medical history.  History reviewed. No pertinent surgical history.  There were no vitals filed for this visit.   Subjective Assessment - 07/14/21 1405     Subjective Patient reports no soreness with internal pelvic floor assessment last visit. Patient has not been able to practice dilators at home or self-exploration but does report having a better sleep routine this week since leaving part-time job. Patient states having increased pain in the B buttock and has noticed it intermittently after sitting for more than 10 mins. She reports this has been going for awhile even before physical therapy.    Currently in Pain? Yes    Pain Score 5     Pain Location Buttocks    Pain Descriptors / Indicators Aching             TREATMENT    Neuromuscular Re-education: Patient discussion on postural awareness in sitting (decrease posterior pelvic tilt and thoracic kyphosis) to aid in intermittent pain that arises in low back and B buttocks. Patient demonstrated ability to perform anterior pelvic tilts and seated scapular retractions.   Supine diaphragmatic breathing with VCs and TCs for downregulation of nervous system and improved IAP management   Supine PFM  lengthening activities with coordinated breath prior to PFM internal techniques to ensure optimal PFM relaxation              Supine single knee to chest   Supine butterfly pose    Manual Therapy: Patient consented to performing PFM internal techniques:  STM and TPR performed at B puborectalis with coordinated breath to allow for decreased tension and pain and improved posture and function  TPR at vaginal opening (6 o'clock) to DIP to allow for stretching of vaginal canal      Patient Response to Intervention: Patient with increased pressure at R puborectalis >L, and increased pressure at 6 o'clock slightly past DIP insertion but patient able to control with breath. Patient reports being content with how she is progressing with internal techniques.    ASSESSMENT Patient presents to clinic with excellent motivation to participate in today's session. Patient continues to demonstrate deficits in PFM coordination, PFM lengthening, posture and pain. Patient able to tolerate internal techniques at the pelvic floor to the level of the puborectalis B with increased pain to the R; however, patient able to control with diaphragmatic breathing. Patient tolerated slightly more insertion at the vaginal canal (past DIP) and used breath to decrease any tension or pressure felt at the pelvic floor. Patient demonstrated increased B adductor fasciculations during internal techniques but it did not inhibit progression of session. Patient with basic understanding of posture in sitting and  adjusting posture when pain arises. Patient responded well to active, manual, and educational interventions and will benefit from continued skilled physical therapy to address deficits in PFM coordination, PFM extensibility, nervous system downregulation, pain, and posture in order to increase function, and improve overall QOL.        PT Long Term Goals - 05/24/21 1604       PT LONG TERM GOAL #1   Title Patient will  demonstrate independent and coordinated diaphragmatic breathing in supine with a 1:2 breathing pattern for improved down-regulation of the nervous system and improved management of intra-abdominal pressures in order to increase function at home and in the community.    Baseline IE: not initiated    Time 12    Period Weeks    Status New    Target Date 08/16/21      PT LONG TERM GOAL #2   Title Patient will decrease worst pain as reported on NPRS by at least 2 points to demonstrate clinically significant reduction in pain in order to participate in a pelvic exam, penetrative sex, and improve overall QOL.    Baseline IE: 8/10    Time 12    Period Weeks    Status New    Target Date 08/16/21      PT LONG TERM GOAL #3   Title Patient will be able to a score </= 4 on the FOTO-PFDI Pain outcome measure in order to demonstrate a clinically significant change and improve overall QOL.    Baseline IE: 17    Time 12    Period Weeks    Status New    Target Date 08/16/21      PT LONG TERM GOAL #4   Title Patient will demonstrate understanding of basic self-management/down-regulation of the nervous system for persistent pain condition and stress as evidenced by diaphragmatic breathing without cueing, body scan/progressive relaxation meditation, and improved sleep hygiene in order to transition to independent management of patient's chief complaint:pain and inability to participate in well-woman pelvic exam.    Baseline IE: not demonstrated    Time 12    Period Weeks    Status New    Target Date 08/16/21                   Plan - 07/14/21 1406     Clinical Impression Statement Patient presents to clinic with excellent motivation to participate in today's session. Patient continues to demonstrate deficits in PFM coordination, PFM lengthening, posture and pain. Patient able to tolerate internal techniques at the pelvic floor to the level of the puborectalis B with increased pain to the R;  however, patient able to control with diaphragmatic breathing. Patient tolerated slightly more insertion at the vaginal canal (past DIP) and used breath to decrease any tension or pressure felt at the pelvic floor. Patient demonstrated increased B adductor fasciculations during internal techniques but it did not inhibit progression of session. Patient with basic understanding of posture in sitting and adjusting posture when pain arises. Patient responded well to active, manual, and educational interventions and will benefit from continued skilled physical therapy to address deficits in PFM coordination, PFM extensibility, nervous system downregulation, pain, and posture in order to increase function, and improve overall QOL.    Personal Factors and Comorbidities Education;Behavior Pattern;Comorbidity 1;Past/Current Experience    Comorbidities hx of kidney stones, left nephrolithiasis    Examination-Activity Limitations Other    Examination-Participation Restrictions Interpersonal Relationship;Other    Stability/Clinical Decision Making Evolving/Moderate complexity  Rehab Potential Good    PT Frequency 1x / week    PT Duration 12 weeks    PT Treatment/Interventions ADLs/Self Care Home Management;Cryotherapy;Moist Heat;Functional mobility training;Therapeutic activities;Therapeutic exercise;Neuromuscular re-education;Patient/family education;Manual techniques    PT Next Visit Plan internal techniques    PT Home Exercise Plan continue dilator training and self-exploration at home    Consulted and Agree with Plan of Care Patient             Patient will benefit from skilled therapeutic intervention in order to improve the following deficits and impairments:  Decreased mobility, Decreased endurance, Increased muscle spasms, Improper body mechanics, Decreased activity tolerance, Decreased coordination, Decreased strength, Postural dysfunction, Pain  Visit Diagnosis: Pelvic pain  Pelvic floor  tension  Other lack of coordination     Problem List There are no problems to display for this patient.   Halvor Behrend, Student-PT  This entire session was performed under direct supervision and direction of a licensed Estate agent. I have personally read, edited and approve of the note as written. Sheria Lang PT, DPT [phone]  07/15/2021, 9:17 AM  [facility] [address] [place], [place], [address] Phone: [phone]   Fax:  [phone]  Name: Kathleen Kelley MRN: [id number] Date of Birth: 1991-09-25

## 2021-07-19 ENCOUNTER — Other Ambulatory Visit: Payer: Self-pay

## 2021-07-19 ENCOUNTER — Encounter: Payer: Self-pay | Admitting: Physical Therapy

## 2021-07-19 ENCOUNTER — Ambulatory Visit: Payer: BC Managed Care – PPO | Admitting: Physical Therapy

## 2021-07-19 DIAGNOSIS — R278 Other lack of coordination: Secondary | ICD-10-CM | POA: Diagnosis not present

## 2021-07-19 DIAGNOSIS — M6289 Other specified disorders of muscle: Secondary | ICD-10-CM

## 2021-07-19 DIAGNOSIS — R102 Pelvic and perineal pain: Secondary | ICD-10-CM

## 2021-07-19 NOTE — Therapy (Signed)
[facility] [address] [place], [place], [address] Phone: [phone]   Fax:  [phone]  Physical Therapy Treatment  Patient Details  Name: Kathleen Kelley MRN: [id number] Date of Birth: 1990-12-13 Referring Provider (PT): Schermerhorn, Maisie Fus   Encounter Date: 07/19/2021   PT End of Session - 07/19/21 1413     Visit Number 9    Number of Visits 12    Date for PT Re-Evaluation 08/16/21    Authorization Type IE: 05/24/2021    PT Start Time 1415    PT Stop Time 1500    PT Time Calculation (min) 45 min    Activity Tolerance Patient tolerated treatment well    Behavior During Therapy [facility] for tasks assessed/performed             History reviewed. No pertinent past medical history.  History reviewed. No pertinent surgical history.  There were no vitals filed for this visit.   Subjective Assessment - 07/19/21 1412     Subjective Patient reports working on postural awareness and states trying to fix it (anterior pelvic tilts/scapular retractions) during the past week. Patient had tried dilators this week and she feels she is hitting the "wrong spot." Patient felt the pressure and "hitting a wall." She tried the diaphragmatic breathing, but still felt the pressure at the area.    Currently in Pain? No/denies            TREATMENT    Neuromuscular Re-education: Patient discussion on dilator training progression with home dilator use. Patient informed on the amount of time to dedicated to dilator training without causing spasms of the PFM or fatigue of surrounding musculature. Patient also informed about taking time to relax the PFM prior to dilator insertion. Patient verbalized understanding and demonstrated ability in clinic.   Supine diaphragmatic breathing with VCs and TCs for downregulation of nervous system and improved IAP management   Manual Therapy: Patient guided in vulva anatomy and able to understand the  vaginal opening vs. perineum with visual feedback (mirror) and coloration of tissues.  Patient performed dilator training individually with visual feedback (mirror) and guidance from DPT and SPT   Patient tolerated at least 6 mins of dilator insertion equivalent to, IP of index finger, and able to control pressure felt with diaphragmatic breathing     Patient Response to Intervention: Patient tolerated dilator training without pain and felt comfortable throughout the session.    ASSESSMENT Patient presents to clinic with excellent motivation to participate in today's session. Patient demonstrates deficits in PFM coordination, PFM lengthening, posture and pain. Patient able to tolerate dilator training in-clinic with moderate VCs and TCs for location of vaginal opening and positioning of dilator. Pressure points located in the canal with the dilator but patient was able to control with diaphragmatic breathing with dilator insertion. Patient with basic understanding of vulva anatomy, access to vaginal opening, and progression of dilator training and responded positively to manual and educational interventions. Based on FOTO-PFDI Pain outcome measure (4), the patient has reached a clinically significant change but still warrants further physical therapy services to address ongoing long-term goals not yet achieved. Patient responded well to active, manual, and educational interventions and will benefit from continued skilled physical therapy to address deficits in PFM coordination, PFM extensibility, nervous system downregulation, pain, and posture in order to increase function, and improve overall QOL.         PT Long Term Goals - 07/19/21 1423  PT LONG TERM GOAL #1   Title Patient will demonstrate independent and coordinated diaphragmatic breathing in supine with a 1:2 breathing pattern for improved down-regulation of the nervous system and improved management of intra-abdominal pressures in  order to increase function at home and in the community.    Baseline IE: not initiated, 10/24: demonstrates well in supine    Time 12    Period Weeks    Status Achieved    Target Date 08/16/21      PT LONG TERM GOAL #2   Title Patient will decrease worst pain as reported on NPRS by at least 2 points to demonstrate clinically significant reduction in pain in order to participate in a pelvic exam, penetrative sex, and improve overall QOL.    Baseline IE: 8/10; 10/24: 0/10 (no pain with dilator insertion just pressure)    Time 12    Period Weeks    Status On-going    Target Date 08/16/21      PT LONG TERM GOAL #3   Title Patient will be able to a score </= 4 on the FOTO-PFDI Pain outcome measure in order to demonstrate a clinically significant change and improve overall QOL.    Baseline IE: 17; 10/24: 4    Time 12    Period Weeks    Status Achieved    Target Date 08/16/21      PT LONG TERM GOAL #4   Title Patient will demonstrate understanding of basic self-management/down-regulation of the nervous system for persistent pain condition and stress as evidenced by diaphragmatic breathing without cueing, body scan/progressive relaxation meditation, and improved sleep hygiene in order to transition to independent management of patient's chief complaint:pain and inability to participate in well-woman pelvic exam.    Baseline IE: not demonstrated; 10/24: able to acheive diaphragmatic breathing but still working on upregulation of nervous system with dilators to participate in well-woman exam    Time 12    Period Weeks    Status On-going    Target Date 08/16/21                   Plan - 07/19/21 1413     Clinical Impression Statement Patient presents to clinic with excellent motivation to participate in today's session. Patient demonstrates deficits in PFM coordination, PFM lengthening, posture and pain. Patient able to tolerate dilator training in-clinic with moderate VCs and TCs for  location of vaginal opening and positioning of dilator. Pressure points located in the canal with the dilator but patient was able to control with diaphragmatic breathing with dilator insertion. Patient with basic understanding of vulva anatomy, access to vaginal opening, and progression of dilator training and responded positively to manual and educational interventions. Based on FOTO-PFDI Pain outcome measure (4), the patient has reached a clinically significant change but still warrants further physical therapy services to address ongoing long-term goals not yet achieved. Patient responded well to active, manual, and educational interventions and will benefit from continued skilled physical therapy to address deficits in PFM coordination, PFM extensibility, nervous system downregulation, pain, and posture in order to increase function, and improve overall QOL.    Personal Factors and Comorbidities Education;Behavior Pattern;Comorbidity 1;Past/Current Experience    Comorbidities hx of kidney stones, left nephrolithiasis    Examination-Activity Limitations Other    Examination-Participation Restrictions Interpersonal Relationship;Other    Stability/Clinical Decision Making Evolving/Moderate complexity    Rehab Potential Good    PT Frequency 1x / week    PT Duration 12 weeks  PT Treatment/Interventions ADLs/Self Care Home Management;Cryotherapy;Moist Heat;Functional mobility training;Therapeutic activities;Therapeutic exercise;Neuromuscular re-education;Patient/family education;Manual techniques    PT Next Visit Plan internal techniques, continue dilator    PT Home Exercise Plan --    Consulted and Agree with Plan of Care Patient             Patient will benefit from skilled therapeutic intervention in order to improve the following deficits and impairments:  Decreased mobility, Decreased endurance, Increased muscle spasms, Improper body mechanics, Decreased activity tolerance, Decreased  coordination, Decreased strength, Postural dysfunction, Pain  Visit Diagnosis: Pelvic pain  Pelvic floor tension  Other lack of coordination     Problem List There are no problems to display for this patient.  Nalaya Wojdyla, SPT  This entire session was performed under direct supervision and direction of a licensed Estate agent. I have personally read, edited and approve of the note as written.  Sheria Lang PT, DPT [phone]  07/19/2021, 3:36 PM   [facility] [address] [place], [place], [address] Phone: [phone]   Fax:  [phone]  Name: Angelisa Winthrop MRN: [id number] Date of Birth: [birth year]

## 2021-07-26 ENCOUNTER — Encounter: Payer: Self-pay | Admitting: Physical Therapy

## 2021-07-26 ENCOUNTER — Other Ambulatory Visit: Payer: Self-pay

## 2021-07-26 ENCOUNTER — Ambulatory Visit: Payer: BC Managed Care – PPO | Admitting: Physical Therapy

## 2021-07-26 DIAGNOSIS — M6289 Other specified disorders of muscle: Secondary | ICD-10-CM

## 2021-07-26 DIAGNOSIS — R278 Other lack of coordination: Secondary | ICD-10-CM

## 2021-07-26 DIAGNOSIS — R102 Pelvic and perineal pain: Secondary | ICD-10-CM

## 2021-07-26 NOTE — Therapy (Signed)
[facility] [address] [place], [place], [address] Phone: [phone]   Fax:  [phone]  Physical Therapy Treatment Physical Therapy Progress Note   Dates of reporting period  05/24/2021   to   07/26/2021   Patient Details  Name: Kathleen Kelley MRN: [id number] Date of Birth: 04-18-1991 Referring Provider (PT): Schermerhorn, Maisie Fus   Encounter Date: 07/26/2021   PT End of Session - 07/26/21 1408     Visit Number 10    Number of Visits 12    Date for PT Re-Evaluation 08/16/21    Authorization Type IE: 05/24/2021    PT Start Time 1410    PT Stop Time 1450    PT Time Calculation (min) 40 min    Activity Tolerance Patient tolerated treatment well    Behavior During Therapy [facility] for tasks assessed/performed             History reviewed. No pertinent past medical history.  History reviewed. No pertinent surgical history.  There were no vitals filed for this visit.   Subjective Assessment - 07/26/21 1411     Subjective Patient notes that she has been able to bump up in dilator size. She notes that she had some initial discomfort and tension with insertion, but was able to use breath. Patient notes that she does have to use small dilator initially before progressing but denies any significant pain. Patient denies soreness after dilator training, but does report stretch. Patient does have some burning sensation with application of friction to desensitization training, but notes it is not painful.    Currently in Pain? No/denies             TREATMENT    Neuromuscular Re-education: Supine diaphragmatic breathing with VCs and TCs for downregulation of nervous system and improved IAP management  Child's pose stretch for improved PFM spasm release  Happy baby with diaphragmatic breathing for improved PFM spasm release  Manual Therapy: Posterior fourchette stretch with education on strategy and application as well as  HEP with dilator for improved extensibility and pain Pill rolling MFR technique at posterior fourchette for improved extensibility and pain   Patient Response to Intervention: Patient tolerated dilator training without pain and felt comfortable throughout the session.    ASSESSMENT Patient presents to clinic with excellent motivation to participate in today's session. Patient demonstrates deficits in PFM coordination, PFM lengthening, posture and pain. Patient able to tolerate posterior fourchette stretch and pill rolling MFR technique with pain controlled during today's session and responded positively to active and manual interventions. Patient will benefit from continued skilled physical therapy to address deficits in PFM coordination, PFM extensibility, nervous system downregulation, pain, and posture in order to increase function, and improve overall QOL.    PT Long Term Goals - 07/19/21 1423       PT LONG TERM GOAL #1   Title Patient will demonstrate independent and coordinated diaphragmatic breathing in supine with a 1:2 breathing pattern for improved down-regulation of the nervous system and improved management of intra-abdominal pressures in order to increase function at home and in the community.    Baseline IE: not initiated, 10/24: demonstrates well in supine    Time 12    Period Weeks    Status Achieved    Target Date 08/16/21      PT LONG TERM GOAL #2   Title Patient will decrease worst pain as reported on NPRS by at least 2 points to demonstrate clinically significant reduction  in pain in order to participate in a pelvic exam, penetrative sex, and improve overall QOL.    Baseline IE: 8/10; 10/24: 0/10 (no pain with dilator insertion just pressure)    Time 12    Period Weeks    Status On-going    Target Date 08/16/21      PT LONG TERM GOAL #3   Title Patient will be able to a score </= 4 on the FOTO-PFDI Pain outcome measure in order to demonstrate a clinically significant  change and improve overall QOL.    Baseline IE: 17; 10/24: 4    Time 12    Period Weeks    Status Achieved    Target Date 08/16/21      PT LONG TERM GOAL #4   Title Patient will demonstrate understanding of basic self-management/down-regulation of the nervous system for persistent pain condition and stress as evidenced by diaphragmatic breathing without cueing, body scan/progressive relaxation meditation, and improved sleep hygiene in order to transition to independent management of patient's chief complaint:pain and inability to participate in well-woman pelvic exam.    Baseline IE: not demonstrated; 10/24: able to acheive diaphragmatic breathing but still working on upregulation of nervous system with dilators to participate in well-woman exam    Time 12    Period Weeks    Status On-going    Target Date 08/16/21                   Plan - 07/26/21 1408     Clinical Impression Statement Patient presents to clinic with excellent motivation to participate in today's session. Patient demonstrates deficits in PFM coordination, PFM lengthening, posture and pain. Patient able to tolerate posterior fourchette stretch and pill rolling MFR technique with pain controlled during today's session and responded positively to active and manual interventions. Patient will benefit from continued skilled physical therapy to address deficits in PFM coordination, PFM extensibility, nervous system downregulation, pain, and posture in order to increase function, and improve overall QOL.    Personal Factors and Comorbidities Education;Behavior Pattern;Comorbidity 1;Past/Current Experience    Comorbidities hx of kidney stones, left nephrolithiasis    Examination-Activity Limitations Other    Examination-Participation Restrictions Interpersonal Relationship;Other    Stability/Clinical Decision Making Evolving/Moderate complexity    Rehab Potential Good    PT Frequency 1x / week    PT Duration 12 weeks     PT Treatment/Interventions ADLs/Self Care Home Management;Cryotherapy;Moist Heat;Functional mobility training;Therapeutic activities;Therapeutic exercise;Neuromuscular re-education;Patient/family education;Manual techniques    PT Next Visit Plan progress internal techniques    Consulted and Agree with Plan of Care Patient             Patient will benefit from skilled therapeutic intervention in order to improve the following deficits and impairments:  Decreased mobility, Decreased endurance, Increased muscle spasms, Improper body mechanics, Decreased activity tolerance, Decreased coordination, Decreased strength, Postural dysfunction, Pain  Visit Diagnosis: Pelvic pain  Pelvic floor tension  Other lack of coordination     Problem List There are no problems to display for this patient.   Sheria Lang PT, DPT [phone]  07/26/2021, 4:36 PM   [facility] [address] [place], [place], [address] Phone: [phone]   Fax:  [phone]  Name: Kathleen Kelley MRN: [id number] Date of Birth: [birth year]

## 2021-08-02 ENCOUNTER — Other Ambulatory Visit: Payer: Self-pay

## 2021-08-02 ENCOUNTER — Ambulatory Visit: Payer: BC Managed Care – PPO | Attending: Obstetrics and Gynecology | Admitting: Physical Therapy

## 2021-08-02 ENCOUNTER — Encounter: Payer: Self-pay | Admitting: Physical Therapy

## 2021-08-02 DIAGNOSIS — R102 Pelvic and perineal pain: Secondary | ICD-10-CM

## 2021-08-02 DIAGNOSIS — M6289 Other specified disorders of muscle: Secondary | ICD-10-CM

## 2021-08-02 DIAGNOSIS — R278 Other lack of coordination: Secondary | ICD-10-CM

## 2021-08-02 NOTE — Therapy (Signed)
[facility] [address] [place], [place], [address] Phone: [phone]   Fax:  [phone]  Physical Therapy Treatment  Patient Details  Name: Kathleen Kelley MRN: [id number] Date of Birth: 1991/01/24 Referring Provider (PT): Schermerhorn, Maisie Fus   Encounter Date: 08/02/2021   PT End of Session - 08/02/21 1507     Visit Number 11    Number of Visits 12    Date for PT Re-Evaluation 08/16/21    Authorization Type IE: 05/24/2021    PT Start Time 1420    PT Stop Time 1500    PT Time Calculation (min) 40 min    Activity Tolerance Patient tolerated treatment well    Behavior During Therapy [facility] for tasks assessed/performed             History reviewed. No pertinent past medical history.  History reviewed. No pertinent surgical history.  There were no vitals filed for this visit.   Subjective Assessment - 08/02/21 1425     Subjective Patient reports continued success with dilator training. She has been able to increase dilator size but notes that she feels she is blocked when she gets deeper in the vaginal canal. Patient also notes increased R sided tension/burning throughout the vaginal canal. Notes some on L as she progresses deeper, but isn't bothered by this. R side she is able to release with dilator and progress, but does feel discomfort and increased tension.    Currently in Pain? No/denies            TREATMENT    Neuromuscular Re-education: Patient education on self-release of obturator internus mm with use of tennis ball and body positioning/movement (hip hinge, hip rotation, pelvic tilts, pelvic circles). Patient demonstrated all techniques in sitting.  Manual Therapy: STM and TPR performed to R obturator internus mm (external) to allow for decreased tension and pain and improved posture and function. Performed with and without movement (hip rotation)   Patient Response to Intervention: Comfortable to return  in 1 week for any progression.   ASSESSMENT Patient presents to clinic with excellent motivation to participate in today's session. Patient demonstrates deficits in PFM coordination, PFM lengthening, posture and pain. Patient had concordant pressure/tenderness with external TPR of R obturator internus mm during today's session and responded positively to educational and manual interventions. Patient will benefit from continued skilled physical therapy to address deficits in PFM coordination, PFM extensibility, nervous system downregulation, pain, and posture in order to increase function, and improve overall QOL.    PT Long Term Goals - 07/19/21 1423       PT LONG TERM GOAL #1   Title Patient will demonstrate independent and coordinated diaphragmatic breathing in supine with a 1:2 breathing pattern for improved down-regulation of the nervous system and improved management of intra-abdominal pressures in order to increase function at home and in the community.    Baseline IE: not initiated, 10/24: demonstrates well in supine    Time 12    Period Weeks    Status Achieved    Target Date 08/16/21      PT LONG TERM GOAL #2   Title Patient will decrease worst pain as reported on NPRS by at least 2 points to demonstrate clinically significant reduction in pain in order to participate in a pelvic exam, penetrative sex, and improve overall QOL.    Baseline IE: 8/10; 10/24: 0/10 (no pain with dilator insertion just pressure)    Time 12    Period Weeks  Status On-going    Target Date 08/16/21      PT LONG TERM GOAL #3   Title Patient will be able to a score </= 4 on the FOTO-PFDI Pain outcome measure in order to demonstrate a clinically significant change and improve overall QOL.    Baseline IE: 17; 10/24: 4    Time 12    Period Weeks    Status Achieved    Target Date 08/16/21      PT LONG TERM GOAL #4   Title Patient will demonstrate understanding of basic self-management/down-regulation of  the nervous system for persistent pain condition and stress as evidenced by diaphragmatic breathing without cueing, body scan/progressive relaxation meditation, and improved sleep hygiene in order to transition to independent management of patient's chief complaint:pain and inability to participate in well-woman pelvic exam.    Baseline IE: not demonstrated; 10/24: able to acheive diaphragmatic breathing but still working on upregulation of nervous system with dilators to participate in well-woman exam    Time 12    Period Weeks    Status On-going    Target Date 08/16/21                   Plan - 08/02/21 1507     Clinical Impression Statement Patient presents to clinic with excellent motivation to participate in today's session. Patient demonstrates deficits in PFM coordination, PFM lengthening, posture and pain. Patient had concordant pressure/tenderness with external TPR of R obturator internus mm during today's session and responded positively to educational and manual interventions. Patient will benefit from continued skilled physical therapy to address deficits in PFM coordination, PFM extensibility, nervous system downregulation, pain, and posture in order to increase function, and improve overall QOL.    Personal Factors and Comorbidities Education;Behavior Pattern;Comorbidity 1;Past/Current Experience    Comorbidities hx of kidney stones, left nephrolithiasis    Examination-Activity Limitations Other    Examination-Participation Restrictions Interpersonal Relationship;Other    Stability/Clinical Decision Making Evolving/Moderate complexity    Rehab Potential Good    PT Frequency 1x / week    PT Duration 12 weeks    PT Treatment/Interventions ADLs/Self Care Home Management;Cryotherapy;Moist Heat;Functional mobility training;Therapeutic activities;Therapeutic exercise;Neuromuscular re-education;Patient/family education;Manual techniques    PT Next Visit Plan progress internal  techniques    Consulted and Agree with Plan of Care Patient             Patient will benefit from skilled therapeutic intervention in order to improve the following deficits and impairments:  Decreased mobility, Decreased endurance, Increased muscle spasms, Improper body mechanics, Decreased activity tolerance, Decreased coordination, Decreased strength, Postural dysfunction, Pain  Visit Diagnosis: Pelvic floor tension  Other lack of coordination  Pelvic pain     Problem List There are no problems to display for this patient.   Sheria Lang PT, DPT [phone]  08/02/2021, 3:25 PM  [place] [facility] [address] [place], [place], [address] Phone: [phone]   Fax:  [phone]  Name: Kathleen Kelley MRN: [id number] Date of Birth: 1991-04-29

## 2021-08-09 ENCOUNTER — Ambulatory Visit: Payer: BC Managed Care – PPO | Admitting: Physical Therapy

## 2021-08-09 ENCOUNTER — Encounter: Payer: Self-pay | Admitting: Physical Therapy

## 2021-08-09 ENCOUNTER — Other Ambulatory Visit: Payer: Self-pay

## 2021-08-09 DIAGNOSIS — M6289 Other specified disorders of muscle: Secondary | ICD-10-CM | POA: Diagnosis not present

## 2021-08-09 DIAGNOSIS — R278 Other lack of coordination: Secondary | ICD-10-CM

## 2021-08-09 DIAGNOSIS — R102 Pelvic and perineal pain: Secondary | ICD-10-CM

## 2021-08-09 NOTE — Therapy (Signed)
[place] [facility] [address] [place], [place], [address] Phone: [phone]   Fax:  [phone]  Physical Therapy Treatment  Patient Details  Name: Kathleen Kelley MRN: [id number] Date of Birth: 08/30/1991 Referring Provider (PT): Schermerhorn, Maisie Fus   Encounter Date: 08/09/2021   PT End of Session - 08/09/21 1425     Visit Number 12    Number of Visits 12    Date for PT Re-Evaluation 08/16/21    Authorization Type IE: 05/24/2021    PT Start Time 1420    PT Stop Time 1500    PT Time Calculation (min) 40 min    Activity Tolerance Patient tolerated treatment well    Behavior During Therapy [facility] for tasks assessed/performed             History reviewed. No pertinent past medical history.  History reviewed. No pertinent surgical history.  There were no vitals filed for this visit.   Subjective Assessment - 08/09/21 1424     Subjective Patient notes that she has continued to successfully work with dilators but noted during menstruation some difficulty with larger sizes. Patient notes that she also had increased quivering in thighs when working with dilators as well.    Currently in Pain? No/denies              TREATMENT    Neuromuscular Re-education: Patient education on strategies for improved dilator training in the presence of stress or pain including starting with smaller dilator and progressing within the session to a larger one. B hip adductor PNF contract relax for decreased neurological/motor control tightness.  Patient education on variety of hip adductor stretches for improved relaxation during home dilator training.    Patient Response to Intervention: Comfortable to return in 1 week for reassessment  ASSESSMENT Patient presents to clinic with excellent motivation to participate in today's session. Patient demonstrates deficits in PFM coordination, PFM lengthening, posture and pain. Patient with reported  muscle fatigue after supine hip adductor PNF technique (R>L) during today's session and responded positively to educational interventions. Patient will benefit from continued skilled physical therapy to address deficits in PFM coordination, PFM extensibility, nervous system downregulation, pain, and posture in order to increase function, and improve overall QOL.      PT Long Term Goals - 07/19/21 1423       PT LONG TERM GOAL #1   Title Patient will demonstrate independent and coordinated diaphragmatic breathing in supine with a 1:2 breathing pattern for improved down-regulation of the nervous system and improved management of intra-abdominal pressures in order to increase function at home and in the community.    Baseline IE: not initiated, 10/24: demonstrates well in supine    Time 12    Period Weeks    Status Achieved    Target Date 08/16/21      PT LONG TERM GOAL #2   Title Patient will decrease worst pain as reported on NPRS by at least 2 points to demonstrate clinically significant reduction in pain in order to participate in a pelvic exam, penetrative sex, and improve overall QOL.    Baseline IE: 8/10; 10/24: 0/10 (no pain with dilator insertion just pressure)    Time 12    Period Weeks    Status On-going    Target Date 08/16/21      PT LONG TERM GOAL #3   Title Patient will be able to a score </= 4 on the FOTO-PFDI Pain outcome measure in order to  demonstrate a clinically significant change and improve overall QOL.    Baseline IE: 17; 10/24: 4    Time 12    Period Weeks    Status Achieved    Target Date 08/16/21      PT LONG TERM GOAL #4   Title Patient will demonstrate understanding of basic self-management/down-regulation of the nervous system for persistent pain condition and stress as evidenced by diaphragmatic breathing without cueing, body scan/progressive relaxation meditation, and improved sleep hygiene in order to transition to independent management of patient's  chief complaint:pain and inability to participate in well-woman pelvic exam.    Baseline IE: not demonstrated; 10/24: able to acheive diaphragmatic breathing but still working on upregulation of nervous system with dilators to participate in well-woman exam    Time 12    Period Weeks    Status On-going    Target Date 08/16/21                   Plan - 08/09/21 1426     Clinical Impression Statement Patient presents to clinic with excellent motivation to participate in today's session. Patient demonstrates deficits in PFM coordination, PFM lengthening, posture and pain. Patient with reported muscle fatigue after supine hip adductor PNF technique (R>L) during today's session and responded positively to educational interventions. Patient will benefit from continued skilled physical therapy to address deficits in PFM coordination, PFM extensibility, nervous system downregulation, pain, and posture in order to increase function, and improve overall QOL.    Personal Factors and Comorbidities Education;Behavior Pattern;Comorbidity 1;Past/Current Experience    Comorbidities hx of kidney stones, left nephrolithiasis    Examination-Activity Limitations Other    Examination-Participation Restrictions Interpersonal Relationship;Other    Stability/Clinical Decision Making Evolving/Moderate complexity    Rehab Potential Good    PT Frequency 1x / week    PT Duration 12 weeks    PT Treatment/Interventions ADLs/Self Care Home Management;Cryotherapy;Moist Heat;Functional mobility training;Therapeutic activities;Therapeutic exercise;Neuromuscular re-education;Patient/family education;Manual techniques    PT Next Visit Plan progress internal techniques    Consulted and Agree with Plan of Care Patient             Patient will benefit from skilled therapeutic intervention in order to improve the following deficits and impairments:  Decreased mobility, Decreased endurance, Increased muscle spasms,  Improper body mechanics, Decreased activity tolerance, Decreased coordination, Decreased strength, Postural dysfunction, Pain  Visit Diagnosis: Pelvic floor tension  Other lack of coordination  Pelvic pain     Problem List There are no problems to display for this patient.   Sheria Lang PT, DPT [phone]  08/09/2021, 5:00 PM  [place] [facility] [address] [place], [place], [address] Phone: [phone]   Fax:  [phone]  Name: Kathleen Kelley MRN: [id number] Date of Birth: Feb 22, 1991

## 2021-08-16 ENCOUNTER — Encounter: Payer: Self-pay | Admitting: Physical Therapy

## 2021-08-16 ENCOUNTER — Ambulatory Visit: Payer: BC Managed Care – PPO | Admitting: Physical Therapy

## 2021-08-16 ENCOUNTER — Other Ambulatory Visit: Payer: Self-pay

## 2021-08-16 DIAGNOSIS — M6289 Other specified disorders of muscle: Secondary | ICD-10-CM

## 2021-08-16 DIAGNOSIS — R278 Other lack of coordination: Secondary | ICD-10-CM

## 2021-08-16 DIAGNOSIS — R102 Pelvic and perineal pain: Secondary | ICD-10-CM

## 2021-08-16 NOTE — Therapy (Signed)
[place] [facility] [address] [place], [place], [address] Phone: [phone]   Fax:  [phone]  Physical Therapy Treatment  Patient Details  Name: Kathleen Kelley MRN: [id number] Date of Birth: [birth year] Referring Provider (PT): Schermerhorn, Maisie Fus   Encounter Date: 08/16/2021   PT End of Session - 08/16/21 1424     Visit Number 13    Number of Visits 18    Date for PT Re-Evaluation 09/27/21    Authorization Type IE: 05/24/2021    PT Start Time 1420    PT Stop Time 1500    PT Time Calculation (min) 40 min    Activity Tolerance Patient tolerated treatment well    Behavior During Therapy [facility] for tasks assessed/performed             History reviewed. No pertinent past medical history.  History reviewed. No pertinent surgical history.  There were no vitals filed for this visit.   Subjective Assessment - 08/16/21 1607     Subjective Patient reports continued success with dilator training. At this time, she feels no pain at the entrance and just feels pressure and tension in the deeper layers of the pelvic floor. Patient does note that doing adductor stretches on R seems to set of a cascade of muscle fasciculations throughout the region.    Currently in Pain? No/denies             TREATMENT    Neuromuscular Re-education: Reassessed goals; see below. Patient education on gynecological exam including video demonstration on eplvic model of external genitalia inspection, speculum function, speculum insertion and use, and speculum removal for improved understanding of well-woman exam and nervous system downregulation.    Patient Response to Intervention: Comfortable to reach out to gynecology for scheduling.  ASSESSMENT Patient presents to clinic with excellent motivation to participate in today's session. Patient demonstrates deficits in PFM coordination, PFM lengthening, posture and pain. Patient articulating improved  understanding of management of nervous system during gynecological exam after today's session and responded positively to educational interventions. Patient's condition has the potential to improve in response to therapy. Maximum improvement is yet to be obtained. The anticipated improvement is attainable and reasonable in a generally predictable time. Patient will benefit from continued skilled physical therapy to address deficits in PFM coordination, PFM extensibility, nervous system downregulation, pain, and posture in order to increase function, and improve overall QOL.    PT Long Term Goals - 08/16/21 1429       PT LONG TERM GOAL #1   Title Patient will demonstrate independent and coordinated diaphragmatic breathing in supine with a 1:2 breathing pattern for improved down-regulation of the nervous system and improved management of intra-abdominal pressures in order to increase function at home and in the community.    Baseline IE: not initiated, 10/24: demonstrates well in supine    Time 12    Period Weeks    Status Achieved      PT LONG TERM GOAL #2   Title Patient will decrease worst pain as reported on NPRS by at least 2 points to demonstrate clinically significant reduction in pain in order to participate in a pelvic exam, penetrative sex, and improve overall QOL.    Baseline IE: 8/10; 10/24: 0/10 (no pain with dilator insertion just pressure); 11/21: 0/10 (no scratchiness, just pressure with deep penetration with dilator)    Time 12    Period Weeks    Status Achieved      PT LONG  TERM GOAL #3   Title Patient will be able to a score </= 4 on the FOTO-PFDI Pain outcome measure in order to demonstrate a clinically significant change and improve overall QOL.    Baseline IE: 17; 10/24: 4; 11/21: 4    Time 6    Period Weeks    Status On-going    Target Date 09/27/21      PT LONG TERM GOAL #4   Title Patient will demonstrate understanding of basic self-management/down-regulation of  the nervous system for persistent pain condition and stress as evidenced by diaphragmatic breathing without cueing, body scan/progressive relaxation meditation, and improved sleep hygiene in order to transition to independent management of patient's chief complaint:pain and inability to participate in well-woman pelvic exam.    Baseline IE: not demonstrated; 10/24: able to acheive diaphragmatic breathing but still working on upregulation of nervous system with dilators to participate in well-woman exam; 11/21: able to tolerate mid-range to large dilator insertion with no pain, just pressure. Good use of breathing and relaxation techniques for down-regulation    Time 6    Period Weeks    Status On-going    Target Date 09/27/21                   Plan - 08/16/21 1608     Clinical Impression Statement Patient presents to clinic with excellent motivation to participate in today's session. Patient demonstrates deficits in PFM coordination, PFM lengthening, posture and pain. Patient articulating improved understanding of management of nervous system during gynecological exam after today's session and responded positively to educational interventions. Patient's condition has the potential to improve in response to therapy. Maximum improvement is yet to be obtained. The anticipated improvement is attainable and reasonable in a generally predictable time. Patient will benefit from continued skilled physical therapy to address deficits in PFM coordination, PFM extensibility, nervous system downregulation, pain, and posture in order to increase function, and improve overall QOL.    Personal Factors and Comorbidities Education;Behavior Pattern;Comorbidity 1;Past/Current Experience    Comorbidities hx of kidney stones, left nephrolithiasis    Examination-Activity Limitations Other    Examination-Participation Restrictions Interpersonal Relationship;Other    Stability/Clinical Decision Making  Evolving/Moderate complexity    Rehab Potential Good    PT Frequency 1x / week    PT Duration 12 weeks    PT Treatment/Interventions ADLs/Self Care Home Management;Cryotherapy;Moist Heat;Functional mobility training;Therapeutic activities;Therapeutic exercise;Neuromuscular re-education;Patient/family education;Manual techniques    PT Home Exercise Plan dilator training, adductor and hip stretches    Consulted and Agree with Plan of Care Patient             Patient will benefit from skilled therapeutic intervention in order to improve the following deficits and impairments:  Decreased mobility, Decreased endurance, Increased muscle spasms, Improper body mechanics, Decreased activity tolerance, Decreased coordination, Decreased strength, Postural dysfunction, Pain  Visit Diagnosis: Pelvic floor tension  Other lack of coordination  Pelvic pain     Problem List There are no problems to display for this patient.   Sheria Lang PT, DPT [phone]  08/16/2021, 4:17 PM  [place] [facility] [address] [place], [place], [address] Phone: [phone]   Fax:  [phone]  Name: Audreyana Huntsberry MRN: [id number] Date of Birth: 09/07/1991

## 2021-08-30 ENCOUNTER — Ambulatory Visit: Payer: BC Managed Care – PPO | Admitting: Physical Therapy

## 2021-09-13 ENCOUNTER — Other Ambulatory Visit: Payer: Self-pay

## 2021-09-13 ENCOUNTER — Encounter: Payer: Self-pay | Admitting: Physical Therapy

## 2021-09-13 ENCOUNTER — Ambulatory Visit: Payer: BC Managed Care – PPO | Attending: Obstetrics and Gynecology | Admitting: Physical Therapy

## 2021-09-13 DIAGNOSIS — R102 Pelvic and perineal pain: Secondary | ICD-10-CM | POA: Diagnosis present

## 2021-09-13 DIAGNOSIS — M6289 Other specified disorders of muscle: Secondary | ICD-10-CM | POA: Insufficient documentation

## 2021-09-13 DIAGNOSIS — R278 Other lack of coordination: Secondary | ICD-10-CM | POA: Diagnosis present

## 2021-09-13 NOTE — Therapy (Signed)
[facility] [address] [place], [place], [address] Phone: [phone]   Fax:  [phone]  Physical Therapy Treatment  Patient Details  Name: Kathleen Kelley MRN: [id number] Date of Birth: 07-11-1991 Referring Provider (PT): Schermerhorn, Maisie Fus   Encounter Date: 09/13/2021   PT End of Session - 09/13/21 1402     Visit Number 14    Number of Visits 18    Date for PT Re-Evaluation 09/27/21    Authorization Type IE: 05/24/2021    PT Start Time 1405    PT Stop Time 1450    PT Time Calculation (min) 45 min    Activity Tolerance Patient tolerated treatment well    Behavior During Therapy [facility] for tasks assessed/performed             History reviewed. No pertinent past medical history.  History reviewed. No pertinent surgical history.  There were no vitals filed for this visit.   Subjective Assessment - 09/13/21 1404     Subjective Patient notes that she is feeling better but still has soem intermittent pain from her kidney stone. Patient states that she feels everything else with her pelvic symptoms is going well. She did start to feel some increased scratching sensation with self-internal assessment with emergence of UTI/kidney stone concern.            TREATMENT    Neuromuscular Re-education: Patient education on nervous system downtraining strategies for participation in sexual activity including sensate focus, graded exposure via partner involved dilator training, and activity pacing for penetrative sex.    Patient Response to Intervention: Comfortable to return in 2 weeks.  ASSESSMENT Patient presents to clinic with excellent motivation to participate in today's session. Patient demonstrates deficits in PFM coordination, PFM lengthening, posture and pain. Patient appreciative of information on nervous system downtraining in the context of partner intimacy during today's session and responded positively to  educational interventions. Patient will benefit from continued skilled physical therapy to address deficits in PFM coordination, PFM extensibility, nervous system downregulation, pain, and posture in order to increase function, and improve overall QOL.     PT Long Term Goals - 08/16/21 1429       PT LONG TERM GOAL #1   Title Patient will demonstrate independent and coordinated diaphragmatic breathing in supine with a 1:2 breathing pattern for improved down-regulation of the nervous system and improved management of intra-abdominal pressures in order to increase function at home and in the community.    Baseline IE: not initiated, 10/24: demonstrates well in supine    Time 12    Period Weeks    Status Achieved      PT LONG TERM GOAL #2   Title Patient will decrease worst pain as reported on NPRS by at least 2 points to demonstrate clinically significant reduction in pain in order to participate in a pelvic exam, penetrative sex, and improve overall QOL.    Baseline IE: 8/10; 10/24: 0/10 (no pain with dilator insertion just pressure); 11/21: 0/10 (no scratchiness, just pressure with deep penetration with dilator)    Time 12    Period Weeks    Status Achieved      PT LONG TERM GOAL #3   Title Patient will be able to a score </= 4 on the FOTO-PFDI Pain outcome measure in order to demonstrate a clinically significant change and improve overall QOL.    Baseline IE: 17; 10/24: 4; 11/21: 4    Time 6  Period Weeks    Status On-going    Target Date 09/27/21      PT LONG TERM GOAL #4   Title Patient will demonstrate understanding of basic self-management/down-regulation of the nervous system for persistent pain condition and stress as evidenced by diaphragmatic breathing without cueing, body scan/progressive relaxation meditation, and improved sleep hygiene in order to transition to independent management of patient's chief complaint:pain and inability to participate in well-woman pelvic exam.     Baseline IE: not demonstrated; 10/24: able to acheive diaphragmatic breathing but still working on upregulation of nervous system with dilators to participate in well-woman exam; 11/21: able to tolerate mid-range to large dilator insertion with no pain, just pressure. Good use of breathing and relaxation techniques for down-regulation    Time 6    Period Weeks    Status On-going    Target Date 09/27/21                   Plan - 09/13/21 1402     Clinical Impression Statement Patient presents to clinic with excellent motivation to participate in today's session. Patient demonstrates deficits in PFM coordination, PFM lengthening, posture and pain. Patient appreciative of information on nervous system downtraining in the context of partner intimacy during today's session and responded positively to educational interventions. Patient will benefit from continued skilled physical therapy to address deficits in PFM coordination, PFM extensibility, nervous system downregulation, pain, and posture in order to increase function, and improve overall QOL.    Personal Factors and Comorbidities Education;Behavior Pattern;Comorbidity 1;Past/Current Experience    Comorbidities hx of kidney stones, left nephrolithiasis    Examination-Activity Limitations Other    Examination-Participation Restrictions Interpersonal Relationship;Other    Stability/Clinical Decision Making Evolving/Moderate complexity    Rehab Potential Good    PT Frequency 1x / week    PT Duration 12 weeks    PT Treatment/Interventions ADLs/Self Care Home Management;Cryotherapy;Moist Heat;Functional mobility training;Therapeutic activities;Therapeutic exercise;Neuromuscular re-education;Patient/family education;Manual techniques    PT Home Exercise Plan dilator training, adductor and hip stretches    Consulted and Agree with Plan of Care Patient             Patient will benefit from skilled therapeutic intervention in order to  improve the following deficits and impairments:  Decreased mobility, Decreased endurance, Increased muscle spasms, Improper body mechanics, Decreased activity tolerance, Decreased coordination, Decreased strength, Postural dysfunction, Pain  Visit Diagnosis: Pelvic floor tension  Pelvic pain  Other lack of coordination     Problem List There are no problems to display for this patient.   Sheria Lang PT, DPT [phone]  09/13/2021, 3:00 PM  [facility] [address] [place], [place], [address] Phone: [phone]   Fax:  [phone]  Name: Kathleen Kelley MRN: [id number] Date of Birth: 09-14-1991

## 2021-09-28 ENCOUNTER — Ambulatory Visit: Payer: BC Managed Care – PPO | Admitting: Physical Therapy

## 2021-10-11 ENCOUNTER — Encounter: Payer: BC Managed Care – PPO | Admitting: Physical Therapy

## 2022-07-02 ENCOUNTER — Other Ambulatory Visit: Payer: Self-pay

## 2022-07-02 ENCOUNTER — Emergency Department
Admission: EM | Admit: 2022-07-02 | Discharge: 2022-07-02 | Disposition: A | Discharge status: Home / Self Care | Payer: BC Managed Care – PPO | Attending: Emergency Medicine | Admitting: Emergency Medicine

## 2022-07-02 ENCOUNTER — Emergency Department: Payer: BC Managed Care – PPO

## 2022-07-02 ENCOUNTER — Encounter: Payer: Self-pay | Admitting: Emergency Medicine

## 2022-07-02 DIAGNOSIS — I10 Essential (primary) hypertension: Secondary | ICD-10-CM | POA: Insufficient documentation

## 2022-07-02 DIAGNOSIS — R109 Unspecified abdominal pain: Secondary | ICD-10-CM | POA: Diagnosis present

## 2022-07-02 DIAGNOSIS — N132 Hydronephrosis with renal and ureteral calculous obstruction: Secondary | ICD-10-CM | POA: Insufficient documentation

## 2022-07-02 DIAGNOSIS — N200 Calculus of kidney: Secondary | ICD-10-CM

## 2022-07-02 HISTORY — DX: Calculus of kidney: N20.0

## 2022-07-02 LAB — URINALYSIS, ROUTINE W REFLEX MICROSCOPIC
Bilirubin Urine: NEGATIVE
Glucose, UA: NEGATIVE mg/dL
Ketones, ur: 20 mg/dL — AB
Leukocytes,Ua: NEGATIVE
Nitrite: NEGATIVE
Protein, ur: 100 mg/dL — AB
RBC / HPF: >50 RBC/hpf — ABNORMAL HIGH (ref 0–5)
Specific Gravity, Urine: 1.031 — ABNORMAL HIGH (ref 1.005–1.030)
Squamous Epithelial / LPF: >50 — ABNORMAL HIGH (ref 0–5)
pH: 5 (ref 5.0–8.0)

## 2022-07-02 LAB — COMPREHENSIVE METABOLIC PANEL
ALT: 23 U/L (ref 0–44)
AST: 22 U/L (ref 15–41)
Albumin: 4.4 g/dL (ref 3.5–5.0)
Alkaline Phosphatase: 50 U/L (ref 38–126)
Anion gap: 7 (ref 5–15)
BUN: 7 mg/dL (ref 6–20)
CO2: 24 mmol/L (ref 22–32)
Calcium: 9.6 mg/dL (ref 8.9–10.3)
Chloride: 107 mmol/L (ref 98–111)
Creatinine, Ser: 0.91 mg/dL (ref 0.44–1.00)
GFR, Estimated: >60 mL/min (ref >60)
Glucose, Bld: 98 mg/dL (ref 70–99)
Potassium: 4 mmol/L (ref 3.5–5.1)
Sodium: 138 mmol/L (ref 135–145)
Total Bilirubin: 0.7 mg/dL (ref 0.3–1.2)
Total Protein: 7.6 g/dL (ref 6.5–8.1)

## 2022-07-02 LAB — CBC
HCT: 39.5 % (ref 36.0–46.0)
Hemoglobin: 12.4 g/dL (ref 12.0–15.0)
MCH: 26.6 pg (ref 26.0–34.0)
MCHC: 31.4 g/dL (ref 30.0–36.0)
MCV: 84.6 fL (ref 80.0–100.0)
Platelets: 300 10*3/uL (ref 150–400)
RBC: 4.67 MIL/uL (ref 3.87–5.11)
RDW: 13.2 % (ref 11.5–15.5)
WBC: 7.8 10*3/uL (ref 4.0–10.5)
nRBC: 0 % (ref 0.0–0.2)

## 2022-07-02 LAB — POC URINE PREG, ED: Preg Test, Ur: NEGATIVE

## 2022-07-02 MED ORDER — TAMSULOSIN HCL 0.4 MG PO CAPS: 0.4000 mg | ORAL_CAPSULE | Freq: Every day | ORAL | 0 refills | Status: AC

## 2022-07-02 MED ORDER — OXYCODONE-ACETAMINOPHEN 5-325 MG PO TABS
2.0000 | ORAL_TABLET | Freq: Once | ORAL | Status: AC
  Administered 2022-07-02: 2 via ORAL
  Filled 2022-07-02: qty 2

## 2022-07-02 MED ORDER — ONDANSETRON 4 MG PO TBDP
4.0000 mg | ORAL_TABLET | Freq: Once | ORAL | Status: AC
  Administered 2022-07-02: 4 mg via ORAL
  Filled 2022-07-02: qty 1

## 2022-07-02 MED ORDER — ONDANSETRON 4 MG PO TBDP: 4.0000 mg | ORAL_TABLET | Freq: Three times a day (TID) | ORAL | 0 refills | Status: AC | PRN

## 2022-07-02 MED ORDER — OXYCODONE-ACETAMINOPHEN 5-325 MG PO TABS: 1.0000 | ORAL_TABLET | Freq: Four times a day (QID) | ORAL | 0 refills | Status: AC | PRN

## 2022-07-02 MED ORDER — FENTANYL CITRATE PF 50 MCG/ML IJ SOSY
50.0000 ug | PREFILLED_SYRINGE | Freq: Once | INTRAMUSCULAR | Status: AC
  Administered 2022-07-02: 50 ug via INTRAMUSCULAR
  Filled 2022-07-02: qty 1

## 2022-07-02 MED ORDER — SODIUM CHLORIDE 0.9 % IV BOLUS
1000.0000 mL | Freq: Once | INTRAVENOUS | Status: AC
  Administered 2022-07-02: 1000 mL via INTRAVENOUS

## 2022-07-02 NOTE — ED Provider Notes (Signed)
[facility] Provider Note  Patient Contact: 9:20 PM (approximate)   History   Flank Pain   HPI  Kathleen Kelley is a 30 y.o. female presents to the emergency department with left-sided flank pain that became severe today.  Patient stated that she noticed some "bladder cramping" last night and thought that she might be ovulating.  She states that she is 2 weeks out from starting her menstrual cycle.  She states that the pain worsened progressively and patient has had multiple episodes of vomiting today.  She states that she feels very cold.  She denies fever.  She has a history of nephrolithiasis in the past and the pain does feel somewhat similar.  No chest pain, chest tightness or shortness of breath.      Physical Exam   Triage Vital Signs: ED Triage Vitals  Enc Vitals Group     BP 07/02/22 1836 (!) 141/84     Pulse Rate 07/02/22 1836 70     Resp 07/02/22 1836 16     Temp 07/02/22 1836 98.5 F (36.9 C)     Temp Source 07/02/22 1836 Oral     SpO2 07/02/22 1836 100 %     Weight 07/02/22 1837 194 lb 10.7 oz (88.3 kg)     Height 07/02/22 1837 5\' 4"  (1.626 m)     Head Circumference --      Peak Flow --      Pain Score 07/02/22 1837 10     Pain Loc --      Pain Edu? --      Excl. in [place]? --     Most recent vital signs: Vitals:   07/02/22 1836 07/02/22 2106  BP: (!) 141/84 (!) 150/78  Pulse: 70 61  Resp: 16 20  Temp: 98.5 F (36.9 C)   SpO2: 100% 100%     General: Alert and in no acute distress. Eyes:  PERRL. EOMI. Head: No acute traumatic findings ENT:      Nose: No congestion/rhinnorhea.      Mouth/Throat: Mucous membranes are moist. Neck: No stridor. No cervical spine tenderness to palpation. Cardiovascular:  Good peripheral perfusion Respiratory: Normal respiratory effort without tachypnea or retractions. Lungs CTAB. Good air entry to the bases with no decreased or absent breath sounds. Gastrointestinal: Bowel sounds 4 quadrants.  Soft and nontender to palpation. No guarding or rigidity. No palpable masses. No distention. Patient has left sided CVA tenderness.  Musculoskeletal: Full range of motion to all extremities.  Neurologic:  No gross focal neurologic deficits are appreciated.  Skin:   No rash noted Other:   ED Results / Procedures / Treatments   Labs (all labs ordered are listed, but only abnormal results are displayed) Labs Reviewed  URINALYSIS, ROUTINE W REFLEX MICROSCOPIC - Abnormal; Notable for the following components:      Result Value   Color, Urine YELLOW (*)    APPearance CLOUDY (*)    Specific Gravity, Urine 1.031 (*)    Hgb urine dipstick LARGE (*)    Ketones, ur 20 (*)    Protein, ur 100 (*)    RBC / HPF >50 (*)    Bacteria, UA RARE (*)    Squamous Epithelial / LPF >50 (*)    All other components within normal limits  CBC  COMPREHENSIVE METABOLIC PANEL  POC URINE PREG, ED        RADIOLOGY  I personally viewed and evaluated these images as part of my medical decision  making, as well as reviewing the written report by the radiologist.  ED Provider Interpretation: Patient has a 3 mm stone of the left ureter.   PROCEDURES:  Critical Care performed: No  Procedures   MEDICATIONS ORDERED IN ED: Medications  ondansetron (ZOFRAN-ODT) disintegrating tablet 4 mg (4 mg Oral Given 07/02/22 1844)  fentaNYL (SUBLIMAZE) injection 50 mcg (50 mcg Intramuscular Given 07/02/22 2123)  ondansetron (ZOFRAN-ODT) disintegrating tablet 4 mg (4 mg Oral Given 07/02/22 2123)  sodium chloride 0.9 % bolus 1,000 mL (1,000 mLs Intravenous New Bag/Given 07/02/22 2201)     IMPRESSION / MDM / ASSESSMENT AND PLAN / ED COURSE  I reviewed the triage vital signs and the nursing notes.                              Assessment and plan: Flank pain:  31 year old female presents to the emergency department with left-sided pelvic pain.  Patient was hypertensive at triage but vital signs were otherwise  reassuring.  CBC and CMP were within reference range.  Urinalysis shows a large amount of blood but was otherwise reassuring.  CT renal stone study shows a 3 mm stone of the left ureter.  Patient was discharged with Percocet and Flomax and advised to follow-up with primary care as needed.     FINAL CLINICAL IMPRESSION(S) / ED DIAGNOSES   Final diagnoses:  Nephrolithiasis     Rx / DC Orders   ED Discharge Orders          Ordered    oxyCODONE-acetaminophen (PERCOCET/ROXICET) 5-325 MG tablet  Every 6 hours PRN        07/02/22 2258    ondansetron (ZOFRAN-ODT) 4 MG disintegrating tablet  Every 8 hours PRN        07/02/22 2258    tamsulosin (FLOMAX) 0.4 MG CAPS capsule  Daily        07/02/22 2258             Note:  This document was prepared using Dragon voice recognition software and may include unintentional dictation errors.   Pia Mau Sholes, Cordelia Poche 07/02/22 2303    Sharman Cheek, MD 07/03/22 2348

## 2022-07-02 NOTE — ED Triage Notes (Signed)
Pt presents to ER from home with left flank pain that started last night.  Pt reports she felt like a cramping on her stomach last night and today she developed left flank pain.Pt reports she does have a history of kidney stones, pt reports pain feels the same as previous episodes.  Pt denies any blood in urine. Pt denies any urinary symptoms. P talks in complete sentences no respiratory distress noted.

## 2022-07-27 ENCOUNTER — Ambulatory Visit
Admission: RE | Admit: 2022-07-27 | Discharge: 2022-07-27 | Disposition: A | Discharge status: Home / Self Care | Payer: BC Managed Care – PPO | Source: Ambulatory Visit | Attending: Urology | Admitting: Urology

## 2022-07-27 ENCOUNTER — Encounter: Payer: Self-pay | Admitting: Urology

## 2022-07-27 ENCOUNTER — Ambulatory Visit
Admission: RE | Admit: 2022-07-27 | Discharge: 2022-07-27 | Disposition: A | Discharge status: Home / Self Care | Payer: BC Managed Care – PPO | Attending: Urology | Admitting: Urology

## 2022-07-27 ENCOUNTER — Ambulatory Visit (INDEPENDENT_AMBULATORY_CARE_PROVIDER_SITE_OTHER): Payer: BC Managed Care – PPO | Admitting: Urology

## 2022-07-27 VITALS — BP 123/81 | HR 72 | Ht 64.0 in | Wt 168.0 lb

## 2022-07-27 DIAGNOSIS — N200 Calculus of kidney: Secondary | ICD-10-CM

## 2022-07-27 NOTE — Progress Notes (Signed)
07/27/2022 1:34 PM   Kathleen Kelley 12/11/1990 [id number]  Referring provider: No referring provider defined for this encounter.  Chief Complaint  Patient presents with   Nephrolithiasis    HPI: 31 y.o. female presents for follow-up visit.  I initially saw her November 2021 for recurrent nephrolithiasis.  KUB at that time showed a small calcification overlying the left renal outline Follow-up renal ultrasound showed multiple nonobstructive calculi overlying the left renal outline and no right-sided calculi She presented to the [facility] ED [facility]2022 complaining of left flank pain which was severe.  Multiple episodes of emesis. Pain was controlled with parenteral analgesics and antiemetics Stone protocol CT showed multiple punctate bilateral renal calculi up to 2 mm in size.  There was a 3 mm left mid ureteral calculus with mild hydronephrosis She was discharged on oxycodone, Zofran and tamsulosin and subsequently passed her calculus which she brings in today   PMH: Past Medical History:  Diagnosis Date   Renal calculi     Surgical History: No past surgical history on file.  Home Medications:  Allergies as of 07/27/2022   No Known Allergies      Medication List        Accurate as of July 27, 2022  1:34 PM. If you have any questions, ask your nurse or doctor.          HAIR SKIN & NAILS GUMMIES PO Take by mouth.   PARoxetine 30 MG tablet Commonly known as: PAXIL Take 30 mg by mouth daily.   tamsulosin 0.4 MG Caps capsule Commonly known as: FLOMAX Take by mouth.        Allergies: No Known Allergies  Family History: No family history on file.  Social History:  reports that she has never smoked. She has never used smokeless tobacco. She reports that she does not drink alcohol and does not use drugs.   Physical Exam: BP 123/81   Pulse 72   Ht 5\' 4"  (1.626 m)   Wt 168 lb (76.2 kg)   LMP 06/16/2022 (Approximate)   BMI 28.84 kg/m    Constitutional:  Alert and oriented, No acute distress. HEENT: [name] Respiratory: Normal respiratory effort, no increased work of breathing. Neurologic: Grossly intact, no focal deficits, moving all 4 extremities. Psychiatric: Normal mood and affect.   Pertinent Imaging: CT images were personally reviewed and interpreted  CT Renal Stone Study  Narrative CLINICAL DATA:  Flank pain.  Kidney stone suspected.  EXAM: CT ABDOMEN AND PELVIS WITHOUT CONTRAST  TECHNIQUE: Multidetector CT imaging of the abdomen and pelvis was performed following the standard protocol without IV contrast.  RADIATION DOSE REDUCTION: This exam was performed according to the departmental dose-optimization program which includes automated exposure control, adjustment of the mA and/or kV according to patient size and/or use of iterative reconstruction technique.  COMPARISON:  Renal ultrasound 08/20/2020.  No prior CT.  FINDINGS: Lower chest: No acute findings.  The cardiac size is normal.  Hepatobiliary: The liver is unremarkable without contrast. The gallbladder and bile ducts are unremarkable.  Pancreas: Unremarkable without contrast.  Spleen: Unremarkable without contrast.  No splenomegaly.  Adrenals/Urinary Tract: There is no adrenal mass. There are multiple punctate up to 2 mm nonobstructive caliceal stones in both kidneys, and on the left, mild hydroureteronephrosis above a 3 mm pelvic ureteral stone at the level of S1-S2.  This is approximately 10 cm proximal to the UVJ. There is mild left renal and perirenal edema without significant perinephric fluid being seen. The unenhanced  renal cortex is unremarkable bilaterally.  Stomach/Bowel: No dilatation or wall thickening including of the appendix. There is colonic diverticulosis without evidence of diverticulitis.  Vascular/Lymphatic: No significant vascular findings are present. No enlarged abdominal or pelvic lymph nodes.  Reproductive:  The uterus and ovaries are not enlarged. There are multiple phleboliths in the pelvic sidewalls.  Other: There is minimal low-density free fluid in the pelvic cul-de-sac, probably physiologic given age. No abdominal ascites is seen. There is no free hemorrhage, free air, or acute inflammatory changes or abscess. No incarcerated hernia.  Musculoskeletal: Slight lumbar levoscoliosis. No acute or other significant osseous findings.  IMPRESSION: 1. 3 mm pelvic ureteral stone at S1-2, approximately 10 cm from the UVJ, with mild hydroureteronephrosis upstream. Please correlate clinically for infectious complication. 2. Bilateral nonobstructive nephrolithiasis. 3. Minimal pelvic cul-de-sac fluid, probably physiologic given age. 4. Colonic diverticulosis without evidence of diverticulitis.   Electronically Signed By: Almira Bar M.D. On: 07/02/2022 21:21   Assessment & Plan:    1. Nephrolithiasis Passed ureteral calculus; sent for analysis and will call with results KUB ordered today At our last visit we had discussed a metabolic evaluation however she did not have done.   Riki Altes, MD  [facility] [address] [place], [place] [address] [phone]

## 2022-07-31 ENCOUNTER — Encounter: Payer: Self-pay | Admitting: Urology

## 2022-08-01 ENCOUNTER — Telehealth: Payer: Self-pay | Admitting: *Deleted

## 2022-08-01 DIAGNOSIS — N200 Calculus of kidney: Secondary | ICD-10-CM

## 2022-08-01 NOTE — Telephone Encounter (Signed)
-----   Message from Abbie Sons, MD sent at 07/30/2022  8:40 PM EDT ----- Bilateral renal calculi on KUB.  Recommend metabolic evaluation based on age and bilateral renal calculi

## 2022-08-01 NOTE — Telephone Encounter (Signed)
Notified patient as instructed, patient pleased.  Started litholink

## 2022-08-04 LAB — CALCULI, WITH PHOTOGRAPH (CLINICAL LAB)
Calcium Oxalate Monohydrate: 100 %
Weight Calculi: 17 mg

## 2022-08-05 ENCOUNTER — Telehealth: Payer: Self-pay | Admitting: Family Medicine

## 2022-08-05 NOTE — Telephone Encounter (Signed)
Patient notified and voiced understanding. She has an appointment Monday to bring 24 hour urine.

## 2022-08-05 NOTE — Telephone Encounter (Signed)
-----   Message from Riki Altes, MD sent at 08/05/2022 11:04 AM EST ----- Larina Bras analysis was calcium oxalate which is the most common type of stone.  Recommend proceeding with metabolic stone evaluation since she has recurrent urinary calculi.  This may already be set up

## 2022-08-08 ENCOUNTER — Other Ambulatory Visit: Payer: Self-pay

## 2022-08-08 ENCOUNTER — Other Ambulatory Visit: Payer: BC Managed Care – PPO

## 2022-08-08 DIAGNOSIS — N200 Calculus of kidney: Secondary | ICD-10-CM

## 2022-08-09 LAB — LITHOLINK SERUM PANEL
CO2: 23 mmol/L (ref 20–29)
Calcium: 10 mg/dL (ref 8.7–10.2)
Chloride: 104 mmol/L (ref 96–106)
Creatinine, Ser: 0.82 mg/dL (ref 0.57–1.00)
Magnesium: 2.1 mg/dL (ref 1.6–2.3)
Phosphorus: 4.1 mg/dL (ref 3.0–4.3)
Potassium: 4.4 mmol/L (ref 3.5–5.2)
Sodium: 141 mmol/L (ref 134–144)
Uric Acid: 4.1 mg/dL (ref 2.6–6.2)
eGFR: 99 mL/min/{1.73_m2} (ref >59)

## 2022-08-13 LAB — LITHOLINK 24HR URINE PANEL
Ammonium, Urine: 17 mmol/24 hr (ref 15–60)
Calcium Oxalate Saturation: 3.04 — ABNORMAL LOW (ref 6.00–10.00)
Calcium Phosphate Saturation: 0.99 (ref 0.50–2.00)
Calcium, Urine: 109 mg/24 hr (ref <200)
Calcium/Creatinine Ratio: 84 mg/g creat (ref 51–262)
Calcium/Kg Body Weight: 1.5 mg/24 hr/kg (ref <4.0)
Chloride, Urine: 227 mmol/24 hr (ref 70–250)
Citrate, Urine: 624 mg/24 hr (ref >550)
Creatinine, Urine: 1292 mg/24 hr
Creatinine/Kg Body Weight: 17.8 mg/24 hr/kg (ref 8.7–20.3)
Cystine, Urine, Qualitative: NEGATIVE
Magnesium, Urine: 109 mg/24 hr (ref 30–120)
Oxalate, Urine: 33 mg/24 hr (ref 20–40)
Phosphorus, Urine: 543 mg/24 hr — ABNORMAL LOW (ref 600–1200)
Potassium, Urine: 113 mmol/24 hr — ABNORMAL HIGH (ref 20–100)
Protein Catabolic Rate: 1.1 g/kg/24 hr (ref 0.8–1.4)
Sodium, Urine: 226 mmol/24 hr — ABNORMAL HIGH (ref 50–150)
Sulfate, Urine: 52 meq/24 hr (ref 20–80)
Urea Nitrogen, Urine: 10.84 g/24 hr (ref 6.00–14.00)
Uric Acid Saturation: 0.03 (ref <1.00)
Uric Acid, Urine: 593 mg/24 hr (ref <750)
Urine Volume (Preserved): 1760 mL/24 hr (ref 500–4000)
pH, 24 hr, Urine: 7.396 — ABNORMAL HIGH (ref 5.800–6.200)

## 2022-08-15 ENCOUNTER — Telehealth: Payer: Self-pay | Admitting: *Deleted

## 2022-08-15 NOTE — Telephone Encounter (Signed)
-----   Message from Riki Altes, MD sent at 08/14/2022 10:09 AM EST ----- Metabolic stone evaluation: Blood work was normal.  She had 2 abnormalities that can predispose to calcium oxalate stones.  Her urine volume was 1760 mL.  Would recommend she increase her water intake to keep urine output at around 2.5 L/day.  Drinking 3-4 quarts of water per day will accomplish this output.  Her urine sodium was also elevated and would watch the amount of sodium and processed foods.  Schedule follow-up visit with KUB in 1 year

## 2022-08-15 NOTE — Telephone Encounter (Signed)
Notified patient as instructed, patient pleased. Discussed follow-up appointments, patient agrees  

## 2022-09-11 IMAGING — CR DG ABDOMEN 1V
1 series · 2 of 2 positions shown · non-contrast
Comparison: None.

CLINICAL DATA: Kidney stones on the left side

EXAM:
ABDOMEN - 1 VIEW

[Series 1: dg abd 1 view · 0.14mm/px · 2 of 2 slices shown]
[im 1/2]
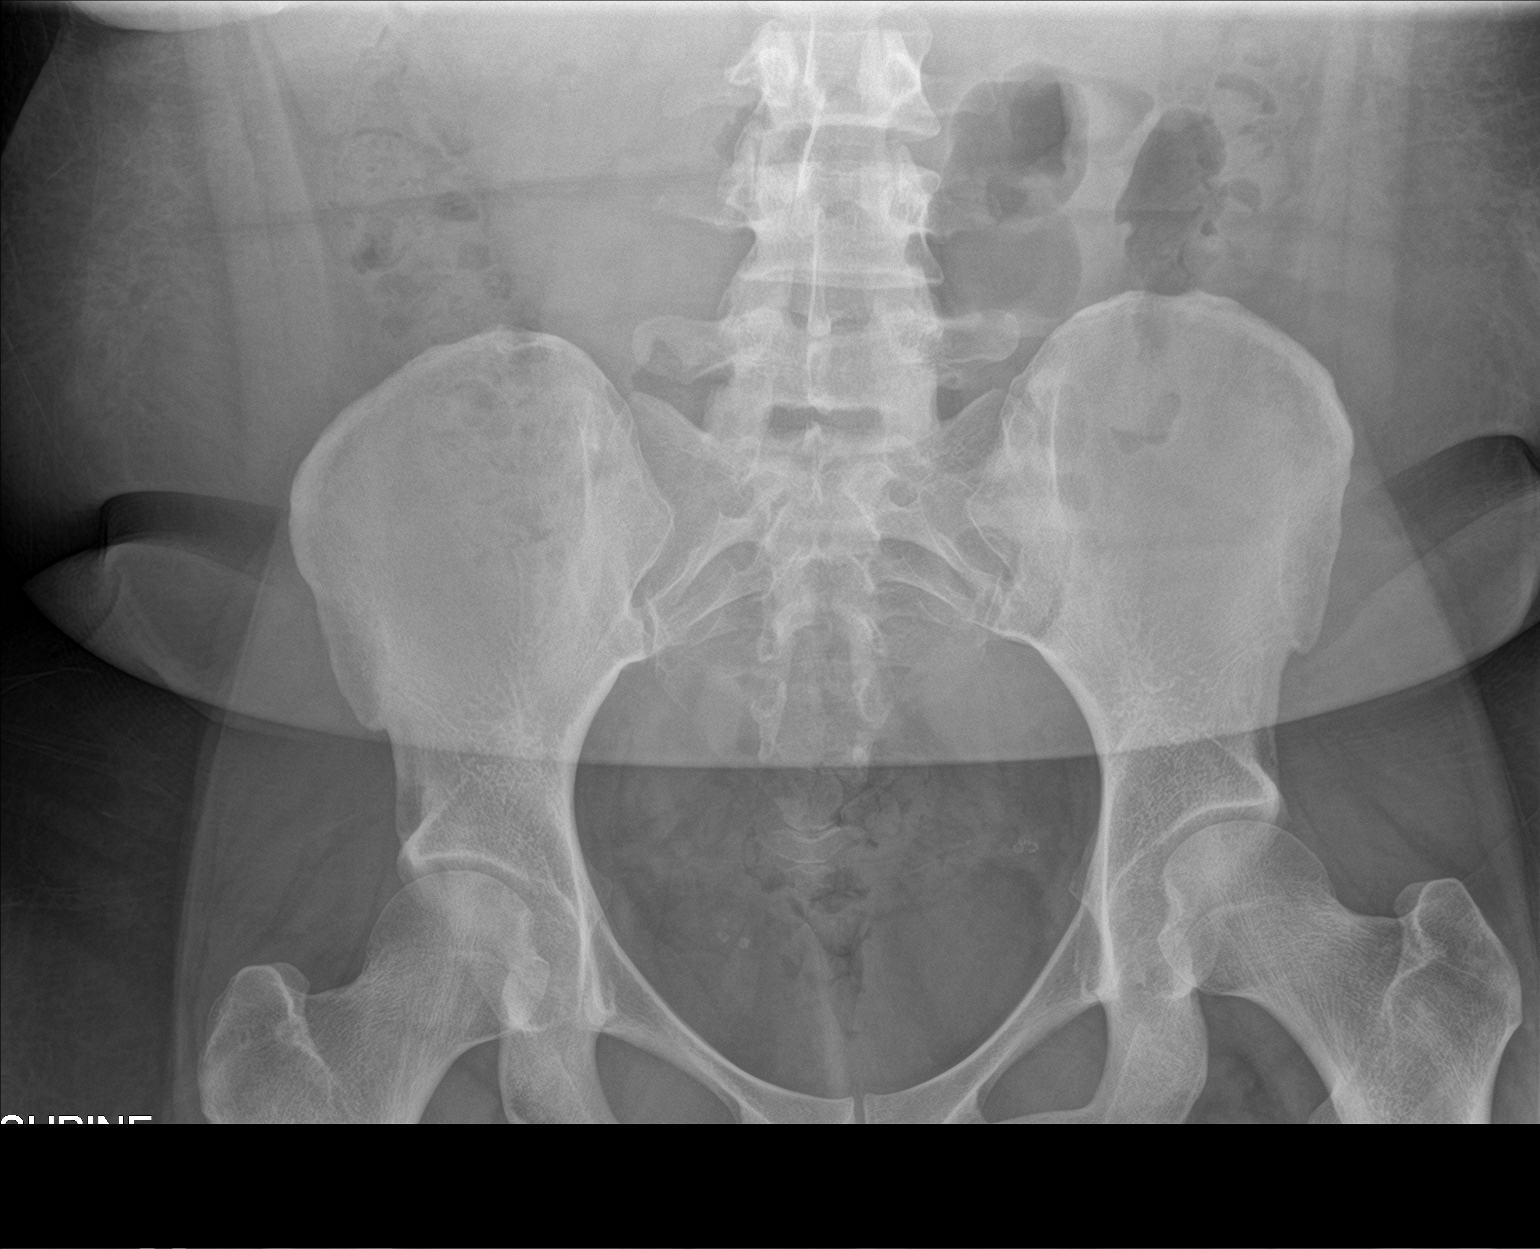
[im 2/2]
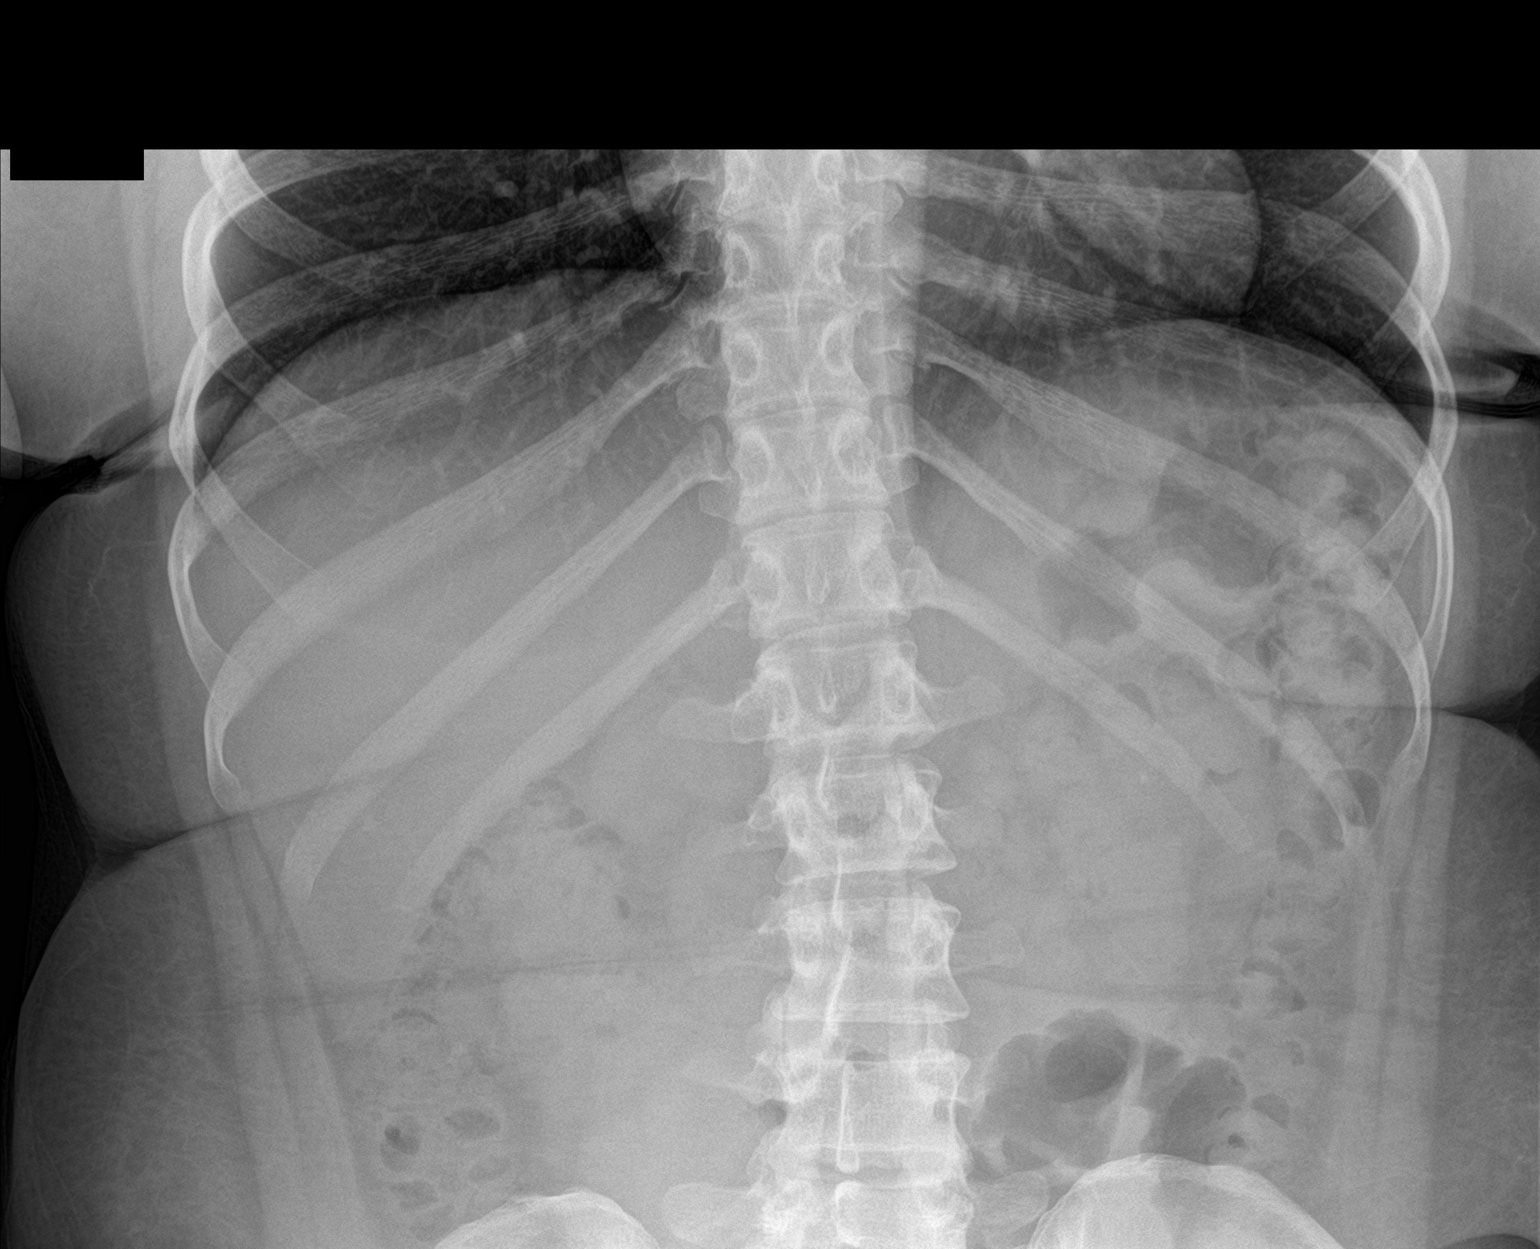

[2 of 2 positions shown; findings below may reference images not displayed]

FINDINGS: There is a punctate stone projecting over the left kidney. No
definite right-sided nephrolithiasis. There are few calcifications
projecting over the patient's pelvis that are favored to represent
phleboliths.
IMPRESSION: Punctate stone projecting over the left kidney. No definite
right-sided nephrolithiasis.

## 2022-09-23 IMAGING — US US RENAL
1 series · 14 of 25 positions shown · non-contrast
Comparison: Prior radiograph from 10/08/2019.

CLINICAL DATA: Initial evaluation for nephrolithiasis.

EXAM:
RENAL / URINARY TRACT ULTRASOUND COMPLETE

[Series 1: us renal · 0.23mm/px · 14 of 48 slices shown]
[im 1/48]
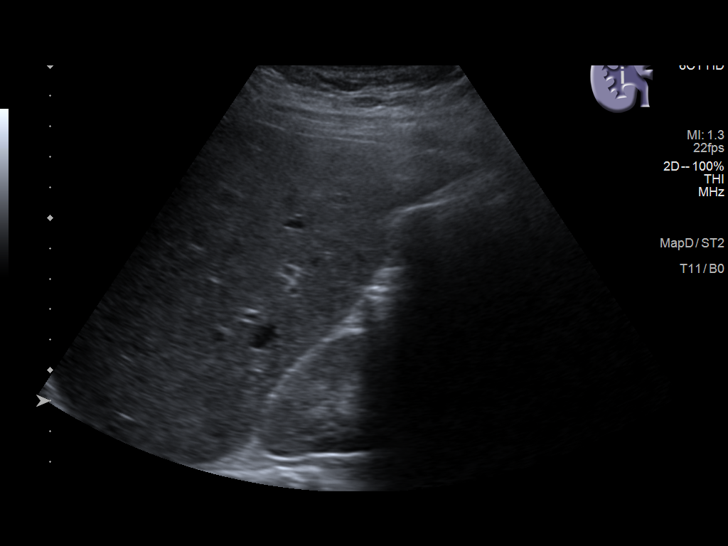
[im 4/48]
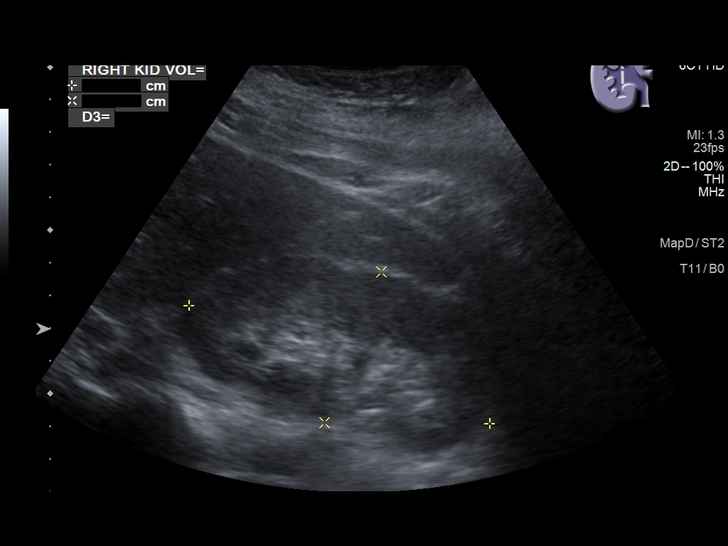
[im 8/48]
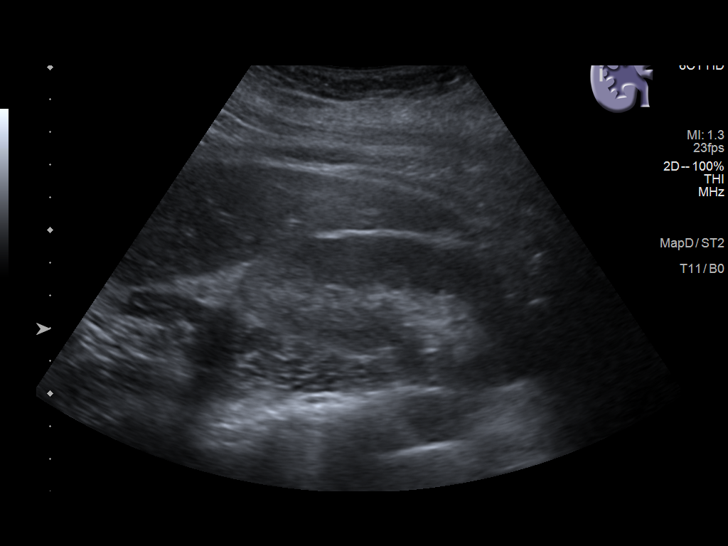
[im 12/48]
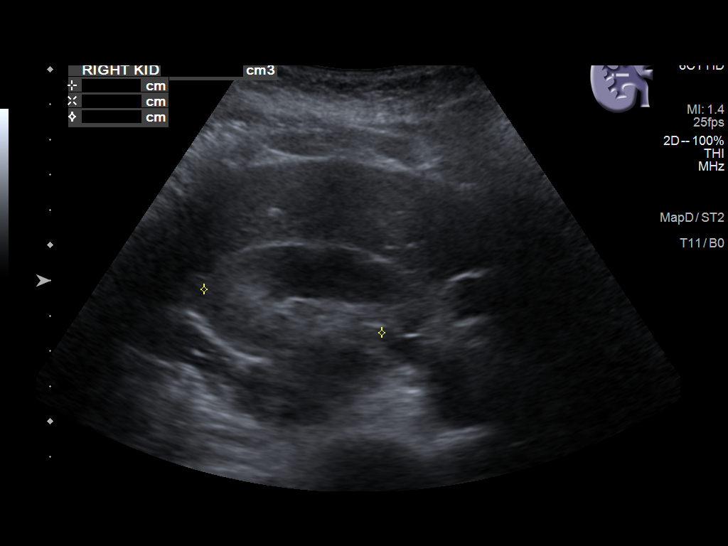
[im 16/48]
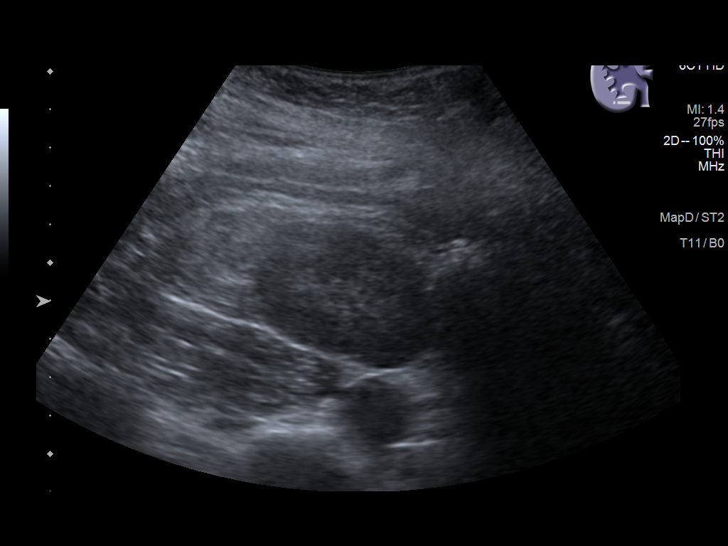
[im 18/48]
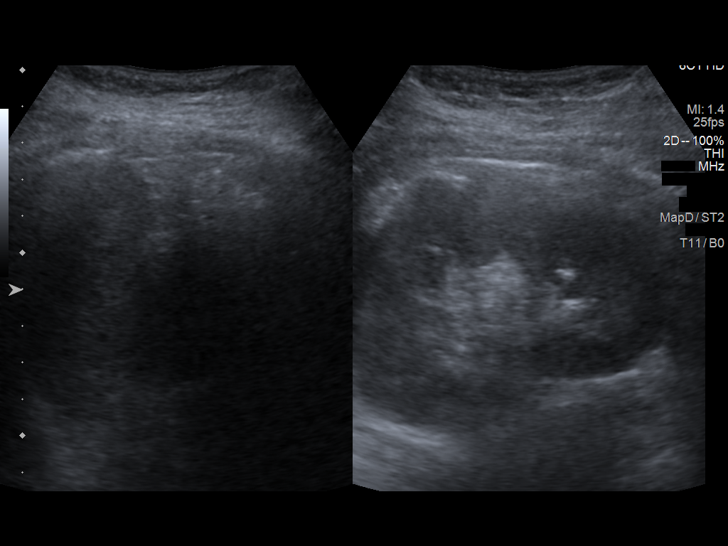
[im 22/48]
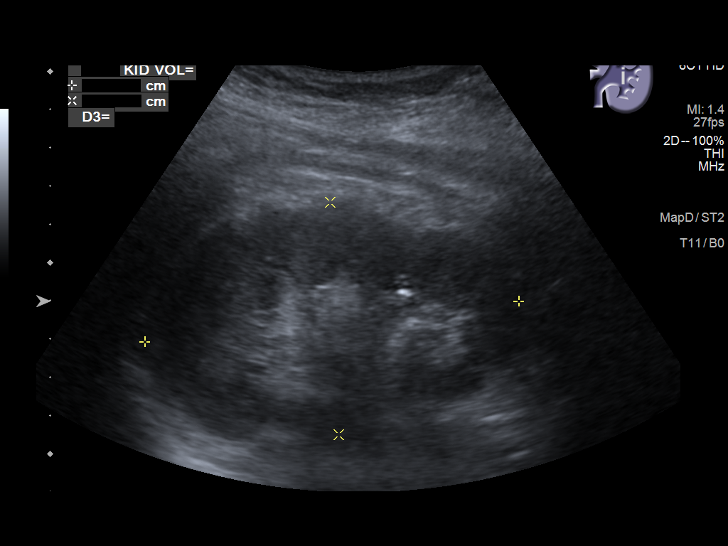
[im 26/48]
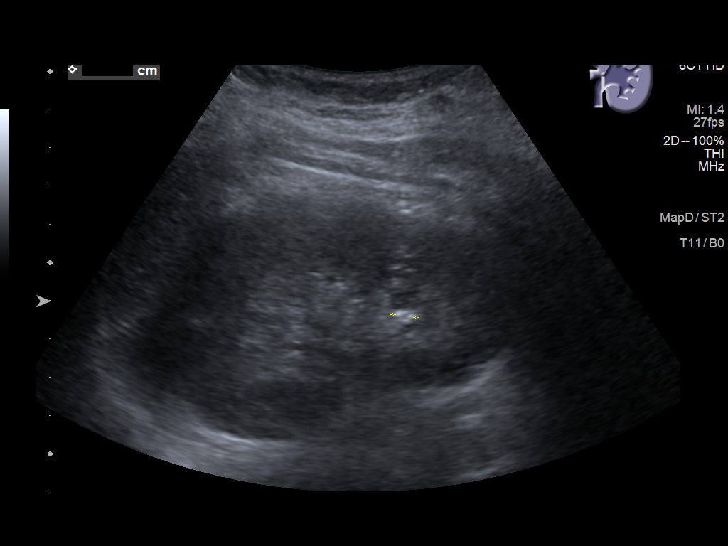
[im 30/48]
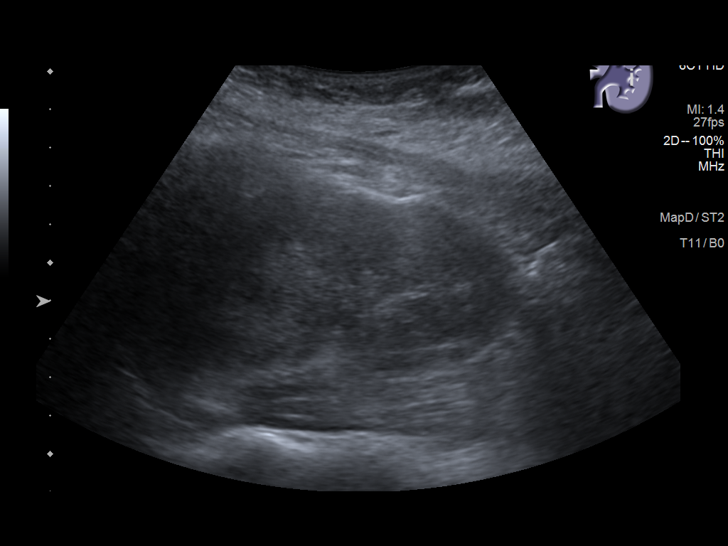
[im 32/48]
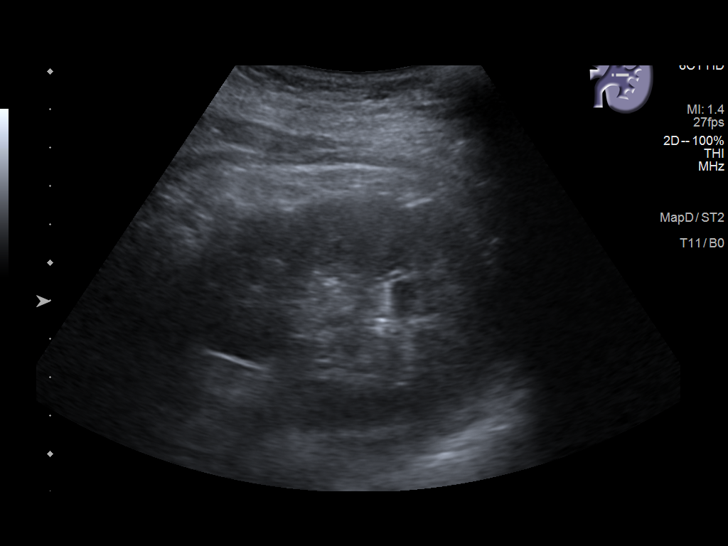
[im 36/48]
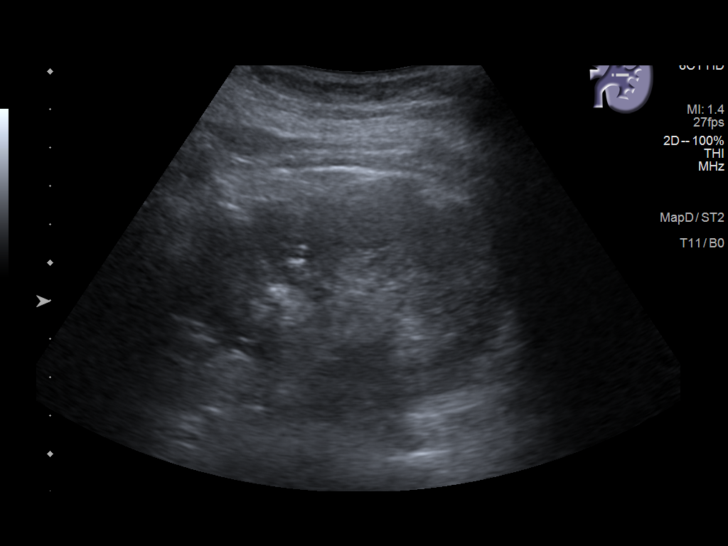
[im 40/48]
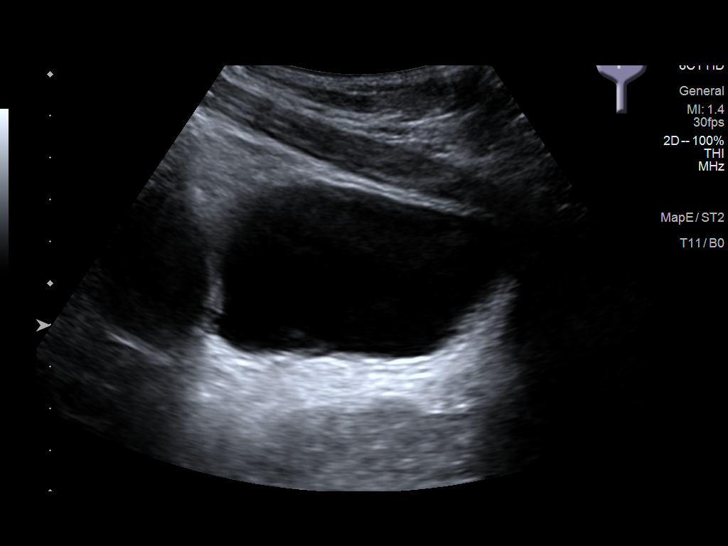
[im 44/48]
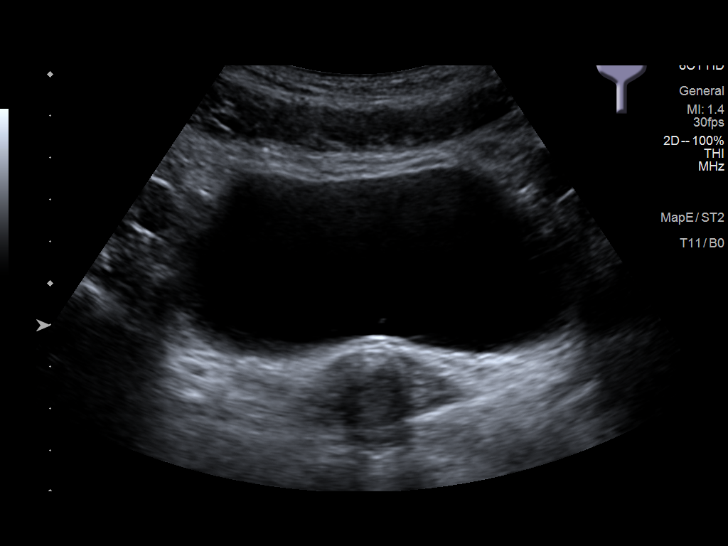
[im 48/48]
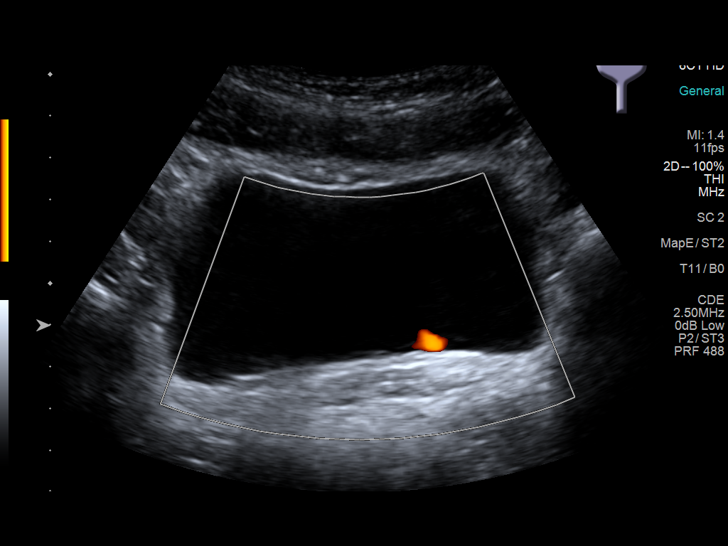

[14 of 25 positions shown; findings below may reference images not displayed]

FINDINGS: Right Kidney:

Renal measurements: 9.9 x 4.9 x 5.2 cm = volume: 132.4 mL. Renal
echogenicity within normal limits. No shadowing echogenic calculi.
No hydronephrosis. No focal renal mass.

Left Kidney:

Renal measurements: 9.8 x 6.1 x 5.6 cm = volume: 176.2 mL. Renal
echogenicity within normal limits. Multiple scattered nonobstructive
echogenic calculi present within the left kidney, largest of which
measures 6 mm at the lower pole. No hydronephrosis. No focal renal
mass.

Bladder:

Appears normal for degree of bladder distention. Bilateral jets are
visualized.

Other:

None.
IMPRESSION: 1. Multiple scattered nonobstructive left renal nephrolithiasis,
largest of which measures 6 mm at the lower pole. No hydronephrosis.
2. Normal sonographic appearance of the right kidney.

## 2023-08-04 ENCOUNTER — Other Ambulatory Visit: Payer: Self-pay | Admitting: *Deleted

## 2023-08-04 DIAGNOSIS — N200 Calculus of kidney: Secondary | ICD-10-CM

## 2023-08-16 ENCOUNTER — Ambulatory Visit: Payer: BC Managed Care – PPO | Admitting: Urology

## 2023-08-16 ENCOUNTER — Ambulatory Visit
Admission: RE | Admit: 2023-08-16 | Discharge: 2023-08-16 | Disposition: A | Discharge status: Home / Self Care | Payer: BC Managed Care – PPO | Source: Ambulatory Visit | Attending: Urology | Admitting: Urology

## 2023-08-16 ENCOUNTER — Encounter: Payer: Self-pay | Admitting: Urology

## 2023-08-16 ENCOUNTER — Ambulatory Visit
Admission: RE | Admit: 2023-08-16 | Discharge: 2023-08-16 | Disposition: A | Discharge status: Home / Self Care | Payer: BC Managed Care – PPO | Attending: Urology | Admitting: Urology

## 2023-08-16 VITALS — BP 128/83 | HR 59 | Ht 64.0 in | Wt 168.0 lb

## 2023-08-16 DIAGNOSIS — N200 Calculus of kidney: Secondary | ICD-10-CM | POA: Diagnosis present

## 2023-08-16 MED ORDER — TAMSULOSIN HCL 0.4 MG PO CAPS: ORAL_CAPSULE | ORAL | 0 refills | Status: AC

## 2023-08-16 NOTE — Progress Notes (Signed)
    I, Kathleen Kelley, acting as a scribe for Kathleen Altes, MD., have documented all relevant documentation on the behalf of Kathleen Altes, MD, as directed by Kathleen Altes, MD while in the presence of Kathleen Altes, MD.  08/16/2023 1:13 PM   Kathleen Kelley May 28, 1991 [id number]  Chief Complaint  Patient presents with   Nephrolithiasis   Urologic history 1. Bilateral nephrolithiasis CT 07/02/22 with bilateral non-obstructing renal calculi. Largest 4 mm and left 4 mm.  Metabolic evaluation remarkable for normal blood work. Low urine volume at 1,760 mL and urine sodium. Passed left ureteral calculus October 2023 with stone analysis 100% calcium oxalate monohydrate  HPI: Kathleen Kelley is a 32 y.o. female presents for annual follow-up.   She passed a small stone last week. She had mild discomfort which resolved No complaints today.  No bothersome low urinary tract symptoms.    PMH: Past Medical History:  Diagnosis Date   Renal calculi     Home Medications:  Allergies as of 08/16/2023   No Known Allergies      Medication List        Accurate as of August 16, 2023  1:13 PM. If you have any questions, ask your nurse or doctor.          HAIR SKIN & NAILS GUMMIES PO Take by mouth.   PARoxetine 30 MG tablet Commonly known as: PAXIL Take 30 mg by mouth daily.   tamsulosin 0.4 MG Caps capsule Commonly known as: FLOMAX 1 capsule daily as needed for recurrent stone symptoms What changed:  how to take this additional instructions Changed by: Kathleen Kelley        Allergies: No Known Allergies  Social History:  reports that she has never smoked. She has never used smokeless tobacco. She reports that she does not drink alcohol and does not use drugs.   Physical Exam: BP 128/83   Pulse (!) 59   Ht 5\' 4"  (1.626 m)   Wt 168 lb (76.2 kg)   LMP 08/10/2023   BMI 28.84 kg/m   Constitutional:  Alert and oriented, No acute distress. HEENT: [place] AT,  moist mucus membranes.  Trachea midline, no masses. Cardiovascular: No clubbing, cyanosis, or edema. Respiratory: Normal respiratory effort, no increased work of breathing. GI: Abdomen is soft, nontender, nondistended, no abdominal masses Skin: No rashes, bruises or suspicious lesions. Neurologic: Grossly intact, no focal deficits, moving all 4 extremities. Psychiatric: Normal mood and affect.   Pertinent Imaging: KUB performed earlier today was personally reviewed and interpreted. There is a moderate amount of stool and bowel gas obscuring the renal outlines. Small calcifications are seen over the superior portion of the left renal outline and mid-portion of the right renal outline. Pelvic phleolith noted in the pelvis bilaterally.    Assessment & Plan:    1. Bilateral nephrolithiasis Stable 1 year follow-up with KUB.  Call as needed for recurrent flank pain Rx tamsulosin sent to pharmacy should she have recurrent stone symptoms.   I have reviewed the above documentation for accuracy and completeness, and I agree with the above.   Kathleen Altes, MD  [facility] [address] [place], [place] [address] [phone]

## 2023-09-13 DIAGNOSIS — N200 Calculus of kidney: Secondary | ICD-10-CM | POA: Insufficient documentation

## 2023-09-14 DIAGNOSIS — F419 Anxiety disorder, unspecified: Secondary | ICD-10-CM | POA: Insufficient documentation

## 2023-11-03 ENCOUNTER — Ambulatory Visit
Admission: RE | Admit: 2023-11-03 | Discharge: 2023-11-03 | Disposition: A | Discharge status: Home / Self Care | Payer: BC Managed Care – PPO | Source: Ambulatory Visit

## 2023-11-03 VITALS — BP 134/84 | HR 54 | Temp 98.3°F | Resp 18

## 2023-11-03 DIAGNOSIS — L0291 Cutaneous abscess, unspecified: Secondary | ICD-10-CM

## 2023-11-03 MED ORDER — DOXYCYCLINE HYCLATE 100 MG PO CAPS: 100.0000 mg | ORAL_CAPSULE | Freq: Two times a day (BID) | ORAL | 0 refills | Status: DC

## 2023-11-03 NOTE — ED Provider Notes (Signed)
 Kathleen Kelley    CSN: [id number] Arrival date & time: 11/03/23  0804      History   Chief Complaint Chief Complaint  Patient presents with   Abscess    Have a boil or abscess near vaginal and buttocks area that's swollen and painful and minimal drainage - Entered by patient    HPI Kathleen Kelley is a 33 y.o. female.  Patient presents with 1 week history of painful, swollen, red abscess on her lower buttock.  The abscess is draining yellow drainage.  She has been treating it with warm compresses.  No fever.  The history is provided by the patient and medical records.    Past Medical History:  Diagnosis Date   Renal calculi     There are no active problems to display for this patient.   History reviewed. No pertinent surgical history.  OB History   No obstetric history on file.      Home Medications    Prior to Admission medications   Medication Sig Start Date End Date Taking? Authorizing Provider  doxycycline  (VIBRAMYCIN ) 100 MG capsule Take 1 capsule (100 mg total) by mouth 2 (two) times daily for 7 days. 11/03/23 11/10/23 Yes Corlis Burnard DEL, NP  YAZ 3-0.02 MG tablet 1 tablet Orally Once a day for 84 days 11/01/23  Yes [provider]  Biotin w/ Vitamins C & E (HAIR SKIN & NAILS GUMMIES PO) Take by mouth.    [provider]  PARoxetine (PAXIL) 30 MG tablet Take 30 mg by mouth daily. Patient not taking: Reported on 11/03/2023    [provider]  tamsulosin  (FLOMAX ) 0.4 MG CAPS capsule 1 capsule daily as needed for recurrent stone symptoms 08/16/23   Twylla Glendia BROCKS, MD    Family History History reviewed. No pertinent family history.  Social History Social History   Tobacco Use   Smoking status: Never   Smokeless tobacco: Never  Substance Use Topics   Alcohol use: No   Drug use: Never     Allergies   Patient has no known allergies.   Review of Systems Review of Systems  Constitutional:  Negative for chills and fever.   Genitourinary:  Negative for pelvic pain and vaginal discharge.  Skin:  Positive for wound. Negative for color change.     Physical Exam Triage Vital Signs ED Triage Vitals  Encounter Vitals Group     BP      Systolic BP Percentile      Diastolic BP Percentile      Pulse      Resp      Temp      Temp src      SpO2      Weight      Height      Head Circumference      Peak Flow      Pain Score      Pain Loc      Pain Education      Exclude from Growth Chart    No data found.  Updated Vital Signs BP 134/84   Pulse (!) 54   Temp 98.3 F (36.8 C)   Resp 18   SpO2 98%   Visual Acuity Right Eye Distance:   Left Eye Distance:   Bilateral Distance:    Right Eye Near:   Left Eye Near:    Bilateral Near:     Physical Exam Exam conducted with a chaperone present [place], CHARITY FUNDRAISER).  Constitutional:  General: She is not in acute distress. HENT:     Mouth/Throat:     Mouth: Mucous membranes are moist.  Cardiovascular:     Rate and Rhythm: Normal rate and regular rhythm.  Pulmonary:     Effort: Pulmonary effort is normal. No respiratory distress.  Genitourinary:    Exam position: Lithotomy position.    Skin:    General: Skin is warm and dry.     Findings: Lesion present.  Neurological:     Mental Status: She is alert.      UC Treatments / Results  Labs (all labs ordered are listed, but only abnormal results are displayed) Labs Reviewed - No data to display  EKG   Radiology No results found.  Procedures Procedures (including critical care time)  Medications Ordered in UC Medications - No data to display  Initial Impression / Assessment and Plan / UC Course  I have reviewed the triage vital signs and the nursing notes.  Pertinent labs & imaging results that were available during my care of the patient were reviewed by me and considered in my medical decision making (see chart for details).    Abscess of right buttock.  No I&D indicated at this  time.  Treating today with doxycycline .  Wound care instructions discussed.  Education provided on abscess.  Instructed patient to follow up with her PCP if her symptoms are not improving.  She agrees to plan of care.   Final Clinical Impressions(s) / UC Diagnoses   Final diagnoses:  Abscess     Discharge Instructions      Take the doxycycline  as directed.  Follow-up with your primary care provider if your symptoms are not improving.      ED Prescriptions     Medication Sig Dispense Auth. Provider   doxycycline  (VIBRAMYCIN ) 100 MG capsule Take 1 capsule (100 mg total) by mouth 2 (two) times daily for 7 days. 14 capsule Corlis Burnard DEL, NP      PDMP not reviewed this encounter.   Corlis Burnard DEL, NP 11/03/23 [phone]

## 2023-11-03 NOTE — Discharge Instructions (Addendum)
Take the doxycycline as directed.    Follow up with your primary care provider if your symptoms are not improving.    

## 2023-11-03 NOTE — ED Triage Notes (Signed)
 Patient to Urgent Care with complaints of an abscess to her genital area. Having some yellow drainage. Area very swollen and red.   Symptoms started approx 10 days ago. Has been using hot compresses.   Denies any fevers. This week developed some groin pain.

## 2023-11-10 ENCOUNTER — Encounter: Payer: Self-pay | Admitting: Emergency Medicine

## 2023-11-10 ENCOUNTER — Ambulatory Visit
Admission: EM | Admit: 2023-11-10 | Discharge: 2023-11-10 | Disposition: A | Discharge status: Home / Self Care | Payer: BC Managed Care – PPO | Attending: Physician Assistant | Admitting: Physician Assistant

## 2023-11-10 DIAGNOSIS — K208 Other esophagitis without bleeding: Secondary | ICD-10-CM

## 2023-11-10 DIAGNOSIS — T364X5A Adverse effect of tetracyclines, initial encounter: Secondary | ICD-10-CM | POA: Diagnosis not present

## 2023-11-10 MED ORDER — PANTOPRAZOLE SODIUM 40 MG PO TBEC: 40.0000 mg | DELAYED_RELEASE_TABLET | Freq: Every day | ORAL | 0 refills | Status: DC

## 2023-11-10 MED ORDER — SUCRALFATE 1 GM/10ML PO SUSP: 1.0000 g | Freq: Three times a day (TID) | ORAL | 0 refills | Status: DC

## 2023-11-10 MED ORDER — ALUM & MAG HYDROXIDE-SIMETH 200-200-20 MG/5ML PO SUSP
30.0000 mL | Freq: Once | ORAL | Status: AC
  Administered 2023-11-10: 30 mL via ORAL

## 2023-11-10 MED ORDER — ONDANSETRON 4 MG PO TBDP: 4.0000 mg | ORAL_TABLET | Freq: Three times a day (TID) | ORAL | 0 refills | Status: AC | PRN

## 2023-11-10 MED ORDER — LIDOCAINE VISCOUS HCL 2 % MT SOLN
15.0000 mL | Freq: Once | OROMUCOSAL | Status: AC
  Administered 2023-11-10: 15 mL via ORAL

## 2023-11-10 NOTE — ED Provider Notes (Signed)
[facility]    CSN: [id number] Arrival date & time: 11/10/23  1511      History   Chief Complaint Chief Complaint  Patient presents with   Abdominal Pain    HPI Kathleen Kelley is a 33 y.o. female presenting for approximately 3 to 4-day history of epigastric and chest burning pain which is worse with eating and drinking.  Reports difficulty swallowing at times and feels like food is getting stuck in her lower esophagus.  No abdominal pain.  Has felt nauseous without vomiting.  Patient has taken omeprazole for a few days without relief.  Started taking Pepcid today.  Patient has been on doxycycline for the past 1 week for an abscess.  Reports she finished the medicine yesterday.  Denies black or bloody stool or coffee-ground emesis.  No fever.  No other complaints.  HPI  Past Medical History:  Diagnosis Date   Renal calculi     There are no active problems to display for this patient.   History reviewed. No pertinent surgical history.  OB History   No obstetric history on file.      Home Medications    Prior to Admission medications   Medication Sig Start Date End Date Taking? Authorizing Provider  ondansetron (ZOFRAN-ODT) 4 MG disintegrating tablet Take 1 tablet (4 mg total) by mouth every 8 (eight) hours as needed. 11/10/23  Yes Eusebio Friendly B, PA-C  pantoprazole (PROTONIX) 40 MG tablet Take 1 tablet (40 mg total) by mouth daily for 14 days. 11/10/23 11/24/23 Yes Eusebio Friendly B, PA-C  sucralfate (CARAFATE) 1 GM/10ML suspension Take 10 mLs (1 g total) by mouth 4 (four) times daily -  with meals and at bedtime. 11/10/23  Yes Eusebio Friendly B, PA-C  YAZ 3-0.02 MG tablet 1 tablet Orally Once a day for 84 days 11/01/23  Yes [provider]  Biotin w/ Vitamins C & E (HAIR SKIN & NAILS GUMMIES PO) Take by mouth.    [provider]  PARoxetine (PAXIL) 30 MG tablet Take 30 mg by mouth daily. Patient not taking: Reported on 11/03/2023    [provider]  tamsulosin (FLOMAX) 0.4 MG CAPS capsule 1 capsule daily as needed for recurrent stone symptoms 08/16/23   Riki Altes, MD    Family History History reviewed. No pertinent family history.  Social History Social History   Tobacco Use   Smoking status: Never   Smokeless tobacco: Never  Vaping Use   Vaping status: Never Used  Substance Use Topics   Alcohol use: No   Drug use: Never     Allergies   Patient has no known allergies.   Review of Systems Review of Systems  Constitutional:  Negative for fatigue and fever.  HENT:  Negative for congestion.   Respiratory:  Negative for cough and shortness of breath.   Cardiovascular:  Positive for chest pain.  Gastrointestinal:  Positive for nausea. Negative for abdominal pain, blood in stool, constipation, diarrhea and vomiting.  Musculoskeletal:  Negative for back pain.  Neurological:  Negative for weakness.     Physical Exam Triage Vital Signs ED Triage Vitals  Encounter Vitals Group     BP      Systolic BP Percentile      Diastolic BP Percentile      Pulse      Resp      Temp      Temp src      SpO2  Weight      Height      Head Circumference      Peak Flow      Pain Score      Pain Loc      Pain Education      Exclude from Growth Chart    No data found.  Updated Vital Signs BP 138/86 (BP Location: Left Arm)   Pulse 63   Temp 98.5 F (36.9 C) (Oral)   Resp 14   Ht 5\' 4"  (1.626 m)   Wt 167 lb 15.9 oz (76.2 kg)   LMP 10/27/2023 (Approximate)   SpO2 98%   BMI 28.84 kg/m   Physical Exam Vitals and nursing note reviewed.  Constitutional:      General: She is not in acute distress.    Appearance: Normal appearance. She is not ill-appearing or toxic-appearing.  HENT:     Head: Normocephalic and atraumatic.     Nose: Nose normal.     Mouth/Throat:     Mouth: Mucous membranes are moist.     Pharynx: Oropharynx is clear.  Eyes:     General: No scleral icterus.       Right eye:  No discharge.        Left eye: No discharge.     Conjunctiva/sclera: Conjunctivae normal.  Cardiovascular:     Rate and Rhythm: Normal rate and regular rhythm.     Heart sounds: Normal heart sounds.  Pulmonary:     Effort: Pulmonary effort is normal. No respiratory distress.     Breath sounds: Normal breath sounds.  Abdominal:     Palpations: Abdomen is soft.     Tenderness: There is no abdominal tenderness.  Musculoskeletal:     Cervical back: Neck supple.  Skin:    General: Skin is dry.  Neurological:     General: No focal deficit present.     Mental Status: She is alert. Mental status is at baseline.     Motor: No weakness.     Gait: Gait normal.  Psychiatric:        Mood and Affect: Mood normal.        Behavior: Behavior normal.      UC Treatments / Results  Labs (all labs ordered are listed, but only abnormal results are displayed) Labs Reviewed - No data to display  EKG   Radiology No results found.  Procedures Procedures (including critical care time)  Medications Ordered in UC Medications  alum & mag hydroxide-simeth (MAALOX/MYLANTA) 200-200-20 MG/5ML suspension 30 mL (30 mLs Oral Given 11/10/23 1538)    And  lidocaine (XYLOCAINE) 2 % viscous mouth solution 15 mL (15 mLs Oral Given 11/10/23 1539)    Initial Impression / Assessment and Plan / UC Course  I have reviewed the triage vital signs and the nursing notes.  Pertinent labs & imaging results that were available during my care of the patient were reviewed by me and considered in my medical decision making (see chart for details).   33 year old female presents for 3 to 4-day history of burning esophageal epigastric abdominal pain.  Symptoms are not improved with omeprazole over the past couple days.  Began Pepcid today.  Has been on doxycycline for the past week but completed medication yesterday.  Vitals are stable and normal.  She is overall well-appearing.  Throat is clear.  Chest clear to  auscultation and heart regular rate and rhythm.  Abdomen soft and nontender.  Drug-induced esophagitis/pill esophagitis.  Patient given GI  cocktail in clinic.  Sent pantoprazole 40 mg tabs and Carafate to pharmacy as well as Zofran.  Encourage small meals, plenty of fluids and rest.  Thoroughly reviewed return and ER precautions.  Advised following up with PCP and/or GI specialist if not improving in the next week or if symptoms were worsening.   Final Clinical Impressions(s) / UC Diagnoses   Final diagnoses:  Esophagitis due to drug     Discharge Instructions      -You have esophageal irritation related to doxycycline. - You have already completed the medicine. - I sent 2 antacids to the pharmacy as well as nausea medicine. - Increase rest and fluids.  Eat smaller meals and soft foods. - This should get better over the next 1 to 2 weeks. - If it does not or the symptoms worsen you may need to be seen again by her PCP and/or referred to GI specialist to evaluate for possible ulcer.     ED Prescriptions     Medication Sig Dispense Auth. Provider   pantoprazole (PROTONIX) 40 MG tablet Take 1 tablet (40 mg total) by mouth daily for 14 days. 14 tablet Eusebio Friendly B, PA-C   sucralfate (CARAFATE) 1 GM/10ML suspension Take 10 mLs (1 g total) by mouth 4 (four) times daily -  with meals and at bedtime. 420 mL Eusebio Friendly B, PA-C   ondansetron (ZOFRAN-ODT) 4 MG disintegrating tablet Take 1 tablet (4 mg total) by mouth every 8 (eight) hours as needed. 15 tablet Gareth Morgan      PDMP not reviewed this encounter.   Shirlee Latch, PA-C 11/10/23 1600

## 2023-11-10 NOTE — Discharge Instructions (Addendum)
-  You have esophageal irritation related to doxycycline. - You have already completed the medicine. - I sent 2 antacids to the pharmacy as well as nausea medicine. - Increase rest and fluids.  Eat smaller meals and soft foods. - This should get better over the next 1 to 2 weeks. - If it does not or the symptoms worsen you may need to be seen again by her PCP and/or referred to GI specialist to evaluate for possible ulcer.

## 2023-11-10 NOTE — ED Triage Notes (Signed)
Patient reports around Tuesday she started getting esophageal spasms and pain.  Patient reports ongoing pain when she eats or drinks.  Patient reports nausea but denies vomiting.

## 2023-12-18 ENCOUNTER — Ambulatory Visit: Payer: Self-pay

## 2023-12-18 NOTE — Telephone Encounter (Signed)
  Chief Complaint: fatigue Symptoms: fatigue, spotting Frequency: 4 days Pertinent Negatives: Patient denies fever Disposition: [] ED /[x] Urgent Care (no appt availability in office) / [] Appointment(In office/virtual)/ []  [facility]/ [] Home Care/ [] Refused Recommended Disposition /[] [place] [facility]/ []  Follow-up with PCP Additional Notes: Patient calls reporting fatigue, spotting since x 4days. She reports fatigue has been approx 1 week and states she feels so tired she could sleep all of the time. Per protocol, patient to be evaluated within 4 hours. Patient is not established with PCP yet, states that she will go to [facility] in [place] for eval. Care advice reviewed, patient verbalized understanding and denies further questions at this time. Alerting PCP for review.    Reason for Disposition  [1] MODERATE weakness (i.e., interferes with work, school, normal activities) AND [2] cause unknown  (Exceptions: Weakness from acute minor illness or poor fluid intake; weakness is chronic and not worse.)  Answer Assessment - Initial Assessment Questions 1. NAME of MEDICINE: "What medicine(s) are you calling about?"     YAZ 3-0.02 MG tablet 2. QUESTION: "What is your question?" (e.g., double dose of medicine, side effect)     Feels fatigued, spotting 3. PRESCRIBER: "Who prescribed the medicine?" Reason: if prescribed by specialist, call should be referred to that group.     [facility] [place] 4. SYMPTOMS: "Do you have any symptoms?" If Yes, ask: "What symptoms are you having?"  "How bad are the symptoms (e.g., mild, moderate, severe)     Spotting, fatigue, headache- states she had to leave work because she was tired. 5. PREGNANCY:  "Is there any chance that you are pregnant?" "When was your last menstrual period?"     Denies- 12/01/23  Answer Assessment - Initial Assessment Questions 1. DESCRIPTION: "Describe how you are feeling."     Feels like she is extremely tired,  feels like she can just go to sleep 2. SEVERITY: "How bad is it?"  "Can you stand and walk?"   - MILD (0-3): Feels weak or tired, but does not interfere with work, school or normal activities.   - MODERATE (4-7): Able to stand and walk; weakness interferes with work, school, or normal activities.   - SEVERE (8-10): Unable to stand or walk; unable to do usual activities.     "Body feels tired" 3. ONSET: "When did these symptoms begin?" (e.g., hours, days, weeks, months)     4 days 4. CAUSE: "What do you think is causing the weakness or fatigue?" (e.g., not drinking enough fluids, medical problem, trouble sleeping)     Thinks it may be related to birth control 5. NEW MEDICINES:  "Have you started on any new medicines recently?" (e.g., opioid pain medicines, benzodiazepines, muscle relaxants, antidepressants, antihistamines, neuroleptics, beta blockers)     Yaz- iron pill and b12. 6. OTHER SYMPTOMS: "Do you have any other symptoms?" (e.g., chest pain, fever, cough, SOB, vomiting, diarrhea, bleeding, other areas of pain)     Fatigue, spotting 7. PREGNANCY: "Is there any chance you are pregnant?" "When was your last menstrual period?"     Denies, LMP 12/01/23  Protocols used: Medication Question Call-A-AH, Weakness (Generalized) and Fatigue-A-AH

## 2024-02-26 ENCOUNTER — Ambulatory Visit
Admission: RE | Admit: 2024-02-26 | Discharge: 2024-02-26 | Disposition: A | Discharge status: Home / Self Care | Payer: Self-pay | Source: Ambulatory Visit | Attending: Physician Assistant | Admitting: Physician Assistant

## 2024-02-26 VITALS — BP 148/82 | HR 58 | Temp 98.9°F | Resp 16

## 2024-02-26 DIAGNOSIS — R0981 Nasal congestion: Secondary | ICD-10-CM | POA: Diagnosis not present

## 2024-02-26 DIAGNOSIS — J029 Acute pharyngitis, unspecified: Secondary | ICD-10-CM | POA: Diagnosis not present

## 2024-02-26 DIAGNOSIS — H9201 Otalgia, right ear: Secondary | ICD-10-CM | POA: Diagnosis not present

## 2024-02-26 LAB — GROUP A STREP BY PCR: Group A Strep by PCR: NOT DETECTED

## 2024-02-26 MED ORDER — IPRATROPIUM BROMIDE 0.06 % NA SOLN: 2.0000 | Freq: Four times a day (QID) | NASAL | 0 refills | Status: AC

## 2024-02-26 MED ORDER — PSEUDOEPH-BROMPHEN-DM 30-2-10 MG/5ML PO SYRP: 10.0000 mL | ORAL_SOLUTION | Freq: Four times a day (QID) | ORAL | 0 refills | Status: DC | PRN

## 2024-02-26 MED ORDER — LIDOCAINE VISCOUS HCL 2 % MT SOLN: 15.0000 mL | OROMUCOSAL | 0 refills | Status: DC | PRN

## 2024-02-26 NOTE — ED Provider Notes (Signed)
 [facility]    CSN: [id number] Arrival date & time: 02/26/24  1507      History   Chief Complaint Chief Complaint  Patient presents with   Sore Throat    Had sinus pressure in face last Wednesday through Friday and sore throat came Friday - Entered by patient    HPI Kathleen Kelley is a 33 y.o. female presenting for 5-day history of illness.  Reports sinus pressure and congestion that started about 5 days ago lasted for couple days.  Over the past 3 days she has developed sore throat, pain in the right ear, swollen lymph nodes and increased fatigue.  Denies fever, cough, sinus pain, chest pain, shortness of breath.  No sick contacts.  Has been taking over-the-counter pain medication and gargling with salt water without relief.  HPI  Past Medical History:  Diagnosis Date   Renal calculi     There are no active problems to display for this patient.   History reviewed. No pertinent surgical history.  OB History   No obstetric history on file.      Home Medications    Prior to Admission medications   Medication Sig Start Date End Date Taking? Authorizing Provider  brompheniramine-pseudoephedrine -DM 30-2-10 MG/5ML syrup Take 10 mLs by mouth 4 (four) times daily as needed for up to 7 days. 02/26/24 03/04/24 Yes Nancy Axon B, PA-C  ipratropium (ATROVENT) 0.06 % nasal spray Place 2 sprays into both nostrils 4 (four) times daily. 02/26/24  Yes Nancy Axon B, PA-C  lidocaine  (XYLOCAINE ) 2 % solution Use as directed 15 mLs in the mouth or throat every 3 (three) hours as needed for mouth pain (swish and spit). 02/26/24  Yes Floydene Hy, PA-C  Biotin w/ Vitamins C & E (HAIR SKIN & NAILS GUMMIES PO) Take by mouth.    [provider]  ondansetron  (ZOFRAN -ODT) 4 MG disintegrating tablet Take 1 tablet (4 mg total) by mouth every 8 (eight) hours as needed. 11/10/23   Floydene Hy, PA-C  pantoprazole  (PROTONIX ) 40 MG tablet Take 1 tablet (40 mg total) by mouth daily  for 14 days. 11/10/23 11/24/23  Floydene Hy, PA-C  PARoxetine (PAXIL) 30 MG tablet Take 30 mg by mouth daily. Patient not taking: Reported on 11/03/2023    [provider]  sucralfate  (CARAFATE ) 1 GM/10ML suspension Take 10 mLs (1 g total) by mouth 4 (four) times daily -  with meals and at bedtime. 11/10/23   Floydene Hy, PA-C  tamsulosin  (FLOMAX ) 0.4 MG CAPS capsule 1 capsule daily as needed for recurrent stone symptoms 08/16/23   Stoioff, Kizzie Perks, MD  YAZ 3-0.02 MG tablet 1 tablet Orally Once a day for 84 days 11/01/23   [provider]    Family History No family history on file.  Social History Social History   Tobacco Use   Smoking status: Never   Smokeless tobacco: Never  Vaping Use   Vaping status: Never Used  Substance Use Topics   Alcohol use: No   Drug use: Never     Allergies   Doxycycline    Review of Systems Review of Systems  Constitutional:  Positive for fatigue. Negative for chills, diaphoresis and fever.  HENT:  Positive for congestion, ear pain, sinus pressure and sore throat. Negative for rhinorrhea and sinus pain.   Respiratory:  Negative for cough and shortness of breath.   Cardiovascular:  Negative for chest pain.  Gastrointestinal:  Negative for abdominal pain, nausea and vomiting.  Musculoskeletal:  Negative for arthralgias and myalgias.  Skin:  Negative for rash.  Neurological:  Negative for weakness and headaches.  Hematological:  Negative for adenopathy.     Physical Exam Triage Vital Signs ED Triage Vitals  Encounter Vitals Group     BP 02/26/24 1538 (!) 148/82     Systolic BP Percentile --      Diastolic BP Percentile --      Pulse Rate 02/26/24 1538 (!) 58     Resp 02/26/24 1538 16     Temp 02/26/24 1538 98.9 F (37.2 C)     Temp Source 02/26/24 1538 Oral     SpO2 02/26/24 1538 98 %     Weight --      Height --      Head Circumference --      Peak Flow --      Pain Score 02/26/24 1536 10     Pain Loc --       Pain Education --      Exclude from Growth Chart --    No data found.  Updated Vital Signs BP (!) 148/82 (BP Location: Right Arm)   Pulse (!) 58   Temp 98.9 F (37.2 C) (Oral)   Resp 16   LMP 02/19/2024 (Approximate)   SpO2 98%    Physical Exam Vitals and nursing note reviewed.  Constitutional:      General: She is not in acute distress.    Appearance: Normal appearance. She is ill-appearing. She is not toxic-appearing.  HENT:     Head: Normocephalic and atraumatic.     Right Ear: Tympanic membrane, ear canal and external ear normal.     Left Ear: Tympanic membrane, ear canal and external ear normal.     Nose: Congestion present.     Mouth/Throat:     Mouth: Mucous membranes are moist.     Pharynx: Oropharynx is clear. Posterior oropharyngeal erythema present.  Eyes:     General: No scleral icterus.       Right eye: No discharge.        Left eye: No discharge.     Conjunctiva/sclera: Conjunctivae normal.  Cardiovascular:     Rate and Rhythm: Regular rhythm. Bradycardia present.     Heart sounds: Normal heart sounds.  Pulmonary:     Effort: Pulmonary effort is normal. No respiratory distress.     Breath sounds: Normal breath sounds.  Musculoskeletal:     Cervical back: Neck supple.  Lymphadenopathy:     Cervical: Cervical adenopathy present.  Skin:    General: Skin is dry.  Neurological:     General: No focal deficit present.     Mental Status: She is alert. Mental status is at baseline.     Motor: No weakness.     Gait: Gait normal.  Psychiatric:        Mood and Affect: Mood normal.        Behavior: Behavior normal.      UC Treatments / Results  Labs (all labs ordered are listed, but only abnormal results are displayed) Labs Reviewed  GROUP A STREP BY PCR    EKG   Radiology No results found.  Procedures Procedures (including critical care time)  Medications Ordered in UC Medications - No data to display  Initial Impression / Assessment and  Plan / UC Course  I have reviewed the triage vital signs and the nursing notes.  Pertinent labs & imaging results that were available during my  care of the patient were reviewed by me and considered in my medical decision making (see chart for details).   33 year old female presents for 5-day history of illness.  Reports sinus pressure and congestion the first couple days which has gotten better.  Reports sore throat, painful swallowing and right-sided ear pain over the past 3 days.  No fever, cough, shortness of breath.  Patient is mildly ill-appearing and holding the right side of her throat.  No acute distress.  On exam no evidence of ear infection.  Has mild nasal congestion, erythema posterior pharynx, swollen lymph node of the left anterior cervical chain.  Chest clear.  Heart regular rate and rhythm.  PCR strep test performed. Negative.   Viral URI. Supportive care advised. Sent Bromfed DM, atrovent nasal spray and viscous lidocaine . Reviewed return precautions. Work note given.   Final Clinical Impressions(s) / UC Diagnoses   Final diagnoses:  Viral pharyngitis  Acute otalgia, right  Nasal congestion     Discharge Instructions      -Negative strep  URI/COLD SYMPTOMS: Your exam today is consistent with a viral illness. Antibiotics are not indicated at this time. Use medications as directed, including cough syrup, nasal saline, and decongestants. Your symptoms should improve over the next few days and resolve within 7-10 days. Increase rest and fluids. F/u if symptoms worsen or predominate such as sore throat, ear pain, productive cough, shortness of breath, or if you develop high fevers or worsening fatigue over the next several days.     ED Prescriptions     Medication Sig Dispense Auth. Provider   lidocaine  (XYLOCAINE ) 2 % solution Use as directed 15 mLs in the mouth or throat every 3 (three) hours as needed for mouth pain (swish and spit). 100 mL Nancy Axon B, PA-C    brompheniramine-pseudoephedrine -DM 30-2-10 MG/5ML syrup Take 10 mLs by mouth 4 (four) times daily as needed for up to 7 days. 150 mL Nancy Axon B, PA-C   ipratropium (ATROVENT) 0.06 % nasal spray Place 2 sprays into both nostrils 4 (four) times daily. 15 mL Floydene Hy, PA-C      PDMP not reviewed this encounter.   Floydene Hy, PA-C 02/26/24 1635

## 2024-02-26 NOTE — ED Triage Notes (Signed)
 Pt presents right side ear pain, sinus pressure and sore throat x 5 days. Pt has taken OTC pain medication and gargling with salt water with no relief.

## 2024-02-26 NOTE — Discharge Instructions (Addendum)
 -  Negative strep.  URI/COLD SYMPTOMS: Your exam today is consistent with a viral illness. Antibiotics are not indicated at this time. Use medications as directed, including cough syrup, nasal saline, and decongestants. Your symptoms should improve over the next few days and resolve within 7-10 days. Increase rest and fluids. F/u if symptoms worsen or predominate such as sore throat, ear pain, productive cough, shortness of breath, or if you develop high fevers or worsening fatigue over the next several days.

## 2024-03-04 ENCOUNTER — Encounter: Payer: Self-pay | Admitting: Family Medicine

## 2024-03-04 ENCOUNTER — Encounter: Payer: Self-pay | Admitting: *Deleted

## 2024-03-04 ENCOUNTER — Ambulatory Visit: Payer: BC Managed Care – PPO | Admitting: Family Medicine

## 2024-03-04 VITALS — BP 128/76 | HR 59 | Temp 99.0°F | Ht 64.0 in | Wt 158.5 lb

## 2024-03-04 DIAGNOSIS — N200 Calculus of kidney: Secondary | ICD-10-CM | POA: Diagnosis not present

## 2024-03-04 DIAGNOSIS — F32A Depression, unspecified: Secondary | ICD-10-CM

## 2024-03-04 DIAGNOSIS — R21 Rash and other nonspecific skin eruption: Secondary | ICD-10-CM | POA: Diagnosis not present

## 2024-03-04 DIAGNOSIS — Z87442 Personal history of urinary calculi: Secondary | ICD-10-CM | POA: Insufficient documentation

## 2024-03-04 DIAGNOSIS — F419 Anxiety disorder, unspecified: Secondary | ICD-10-CM | POA: Diagnosis not present

## 2024-03-04 DIAGNOSIS — F3281 Premenstrual dysphoric disorder: Secondary | ICD-10-CM | POA: Diagnosis not present

## 2024-03-04 MED ORDER — FLUOXETINE HCL 10 MG PO CAPS: 10.0000 mg | ORAL_CAPSULE | Freq: Every day | ORAL | 1 refills | Status: AC

## 2024-03-04 NOTE — Assessment & Plan Note (Addendum)
 For years  Also PMDD  Reviewed stressors/ coping techniques/symptoms/ support sources/ tx options and side effects in detail today  Has been on multiple medicines Sertraline is too sedating Will try fluoxetine 10 mg daily   Discussed expectations of SSRI medication including time to effectiveness and mechanism of action, also poss of side effects (early and late)- including mental fuzziness, weight or appetite change, nausea and poss of worse dep or anxiety (even suicidal thoughts)  Pt voiced understanding and will stop med and update if this occurs    Follow up planned  Encouraged self care and exercise

## 2024-03-04 NOTE — Progress Notes (Signed)
 Subjective:    Patient ID: Kathleen Kelley, female    DOB: 1991-02-28, 33 y.o.   MRN: [id number]  HPI  Wt Readings from Last 3 Encounters:  03/04/24 158 lb 8 oz (71.9 kg)  11/10/23 167 lb 15.9 oz (76.2 kg)  08/16/23 168 lb (76.2 kg)   27.21 kg/m  Vitals:   03/04/24 0951  BP: 128/76  Pulse: (!) 59  Temp: 99 F (37.2 C)  SpO2: 99%    33 yo female presents to establish for primary care  Prevention went to [facility]  Is [employer] employee   Current c/o   Rash under arms  PMDD   Rash under arms  Keeps changing deoderant    Uses powder (monkey butt) -for irritated skin     PMDD OC helped with mood swings  Yaz -caused rash on left nipple  Junel - irregular bleeding Aurovela (?)=made her very anxious    Last time she took zoloft  Very sedating -could not make it to work  Paxil - ? May have still had bad anxiety     Was seen at [facility] for viral pharyngitis recently  (with uri_ Prescription ipratropium nasal spray/ lidocaine  solution and pseudoeph-prom-DM for cough Rapid strep test neg    Per chart History of anxiety and depression  Also PMDD   History of anx/depression also  No hospitalizations No past suicide attempt in past and no current SI    2 therapists currently  This helps   Thinks she saw psychiatrist in the past     Paxil-no emotions  Zoloft-sdation  Wellbutrin - ? More anxious  Effexor -did not help     Klonopin -helps in past      03/04/2024   10:33 AM  Depression screen PHQ 2/9  Decreased Interest 1  Down, Depressed, Hopeless 1  PHQ - 2 Score 2  Altered sleeping 2  Tired, decreased energy 2  Change in appetite 2  Feeling bad or failure about yourself  2  Trouble concentrating 1  Moving slowly or fidgety/restless 0  Suicidal thoughts 0  PHQ-9 Score 11  Difficult doing work/chores Somewhat difficult      03/04/2024   10:33 AM  GAD 7 : Generalized Anxiety Score  Nervous, Anxious, on Edge 2   Control/stop worrying 2  Worry too much - different things 2  Trouble relaxing 2  Restless 0  Easily annoyed or irritable 2  Afraid - awful might happen 1  Total GAD 7 Score 11  Anxiety Difficulty Somewhat difficult    [facility]   In past   History of kidney stones- urology Dr Cherylene Corrente  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 No pertinent surgical history. Social History   Tobacco Use   Smoking status: Never   Smokeless tobacco: Never  Vaping Use   Vaping status: Never Used  Substance Use Topics   Alcohol use: No   Drug use: Never   Family  History  Problem Relation Age of Onset   Cancer Maternal Grandfather    Diabetes Paternal Grandmother    Hypertension Paternal Grandmother    Hyperlipidemia Paternal Grandmother    Hypertension Paternal Grandfather    Hyperlipidemia Paternal Grandfather    Diabetes Paternal Grandfather    Allergies  Allergen Reactions   Latex    Doxycycline      esophagitis   Yaz [Drospirenone-Ethinyl Estradiol] Rash    Rash on breast    Current Outpatient Medications on File Prior to Visit  Medication Sig Dispense Refill   Biotin w/ Vitamins C & E (HAIR SKIN & NAILS GUMMIES PO) Take by mouth.     famotidine (PEPCID) 10 MG tablet Take 10 mg by mouth daily as needed for heartburn or indigestion.     ipratropium (ATROVENT ) 0.06 % nasal spray Place 2 sprays into both nostrils 4 (four) times daily. 15 mL 0   ondansetron  (ZOFRAN -ODT) 4 MG disintegrating tablet Take 1 tablet (4 mg  total) by mouth every 8 (eight) hours as needed. 15 tablet 0   tamsulosin  (FLOMAX ) 0.4 MG CAPS capsule 1 capsule daily as needed for recurrent stone symptoms 30 capsule 0   No current facility-administered medications on file prior to visit.    Review of Systems  Constitutional:  Negative for activity change, appetite change, fatigue, fever and unexpected weight change.  HENT:  Negative for congestion, ear pain, rhinorrhea, sinus pressure and sore throat.   Eyes:  Negative for pain, redness and visual disturbance.  Respiratory:  Negative for cough, shortness of breath and wheezing.   Cardiovascular:  Negative for chest pain and palpitations.  Gastrointestinal:  Negative for abdominal pain, blood in stool, constipation and diarrhea.  Endocrine: Negative for polydipsia and polyuria.  Genitourinary:  Negative for dysuria, frequency and urgency.  Musculoskeletal:  Negative for arthralgias, back pain and myalgias.  Skin:  Positive for rash. Negative for pallor.  Allergic/Immunologic: Negative for environmental allergies.  Neurological:  Negative for dizziness, syncope and headaches.  Hematological:  Negative for adenopathy. Does not bruise/bleed easily.  Psychiatric/Behavioral:  Positive for dysphoric mood and sleep disturbance. Negative for decreased concentration and suicidal ideas. The patient is nervous/anxious.        Objective:   Physical Exam Constitutional:      General: She is not in acute distress.    Appearance: Normal appearance. She is well-developed. She is not ill-appearing or diaphoretic.  HENT:     Head: Normocephalic and atraumatic.  Eyes:     Conjunctiva/sclera: Conjunctivae normal.     Pupils: Pupils are equal, round, and reactive to light.  Neck:     Thyroid: No thyromegaly.     Vascular: No carotid bruit or JVD.  Cardiovascular:     Rate and Rhythm: Normal rate and regular rhythm.     Heart sounds: Normal heart sounds.     No gallop.  Pulmonary:     Effort:  Pulmonary effort is normal. No respiratory distress.     Breath sounds: Normal breath sounds. No wheezing or rales.  Abdominal:     General: There is no distension or abdominal bruit.     Palpations: Abdomen is soft.  Musculoskeletal:     Cervical back: Normal range of motion and neck supple.     Right lower leg: No edema.     Left lower leg: No edema.  Lymphadenopathy:     Cervical: No cervical adenopathy.  Skin:    General: Skin is warm and dry.  Coloration: Skin is not pale.     Findings: No rash.     Comments: Few papules in bilateral axillae  Has shaved  Nothing appears infected Few excoriations   Neurological:     Mental Status: She is alert.     Cranial Nerves: No cranial nerve deficit.     Motor: No weakness.     Coordination: Coordination normal.     Deep Tendon Reflexes: Reflexes are normal and symmetric. Reflexes normal.  Psychiatric:        Attention and Perception: Attention normal.        Mood and Affect: Mood normal. Affect is not flat or tearful.        Speech: Speech normal.        Behavior: Behavior normal.        Cognition and Memory: Cognition and memory normal.     Comments: Mildly anxous Pleasant Candidly discusses symptoms and stressors             Assessment & Plan:   Problem List Items Addressed This Visit       Musculoskeletal and Integument   Rash and nonspecific skin eruption   Pt develops some itchy bumps with deoderant Tolerates powders but they do not work well for sweating  Discussed use of scent free antiperspirant (like dove) and update          Genitourinary   Nephrolithiasis   Under care of urology  Watches diet for this and remains hydrated         Other   PMDD (premenstrual dysphoric disorder) - Primary   In setting of years of depression/anxiety  Worse around menses Multiple meds in past  Has not tolerated OCs so far -encouraged strongly to discuss with gyn as the right one may help greatly   Has been on  multiple medicines Recently zoloft (too sedating) Also paxil, wellbutrin and effexor   Will try fluoxetine 10 mg daily  Discussed expectations of SSRI medication including time to effectiveness and mechanism of action, also poss of side effects (early and late)- including mental fuzziness, weight or appetite change, nausea and poss of worse dep or anxiety (even suicidal thoughts)  Pt voiced understanding and will stop med and update if this occurs   Follow up planned  Handout given       Relevant Medications   FLUoxetine (PROZAC) 10 MG capsule   Anxiety and depression   For years  Also PMDD  Reviewed stressors/ coping techniques/symptoms/ support sources/ tx options and side effects in detail today  Has been on multiple medicines Sertraline is too sedating Will try fluoxetine 10 mg daily   Discussed expectations of SSRI medication including time to effectiveness and mechanism of action, also poss of side effects (early and late)- including mental fuzziness, weight or appetite change, nausea and poss of worse dep or anxiety (even suicidal thoughts)  Pt voiced understanding and will stop med and update if this occurs    Follow up planned  Encouraged self care and exercise            Relevant Medications   FLUoxetine (PROZAC) 10 MG capsule

## 2024-03-04 NOTE — Patient Instructions (Addendum)
 For underarm sweating- us  dove antiperspirant for sensitive skin (not a Deoderant because there is no scent)   Avoid shaving as much as you can    Let's try fluoxietine 10 mg daily for mood and PMDD  We will start with 10 mg  If any intolerable side effects or if you feel worse on it -stop it and let us  know   Follow up in about a month   Continue counseling   Keep talking to your gyn provider about hormonal therapies also

## 2024-03-04 NOTE — Assessment & Plan Note (Signed)
 Under care of urology  Watches diet for this and remains hydrated

## 2024-03-04 NOTE — Assessment & Plan Note (Signed)
 In setting of years of depression/anxiety  Worse around menses Multiple meds in past  Has not tolerated OCs so far -encouraged strongly to discuss with gyn as the right one may help greatly   Has been on multiple medicines Recently zoloft (too sedating) Also paxil, wellbutrin and effexor   Will try fluoxetine 10 mg daily  Discussed expectations of SSRI medication including time to effectiveness and mechanism of action, also poss of side effects (early and late)- including mental fuzziness, weight or appetite change, nausea and poss of worse dep or anxiety (even suicidal thoughts)  Pt voiced understanding and will stop med and update if this occurs   Follow up planned  Handout given

## 2024-03-04 NOTE — Assessment & Plan Note (Signed)
 Pt develops some itchy bumps with deoderant Tolerates powders but they do not work well for sweating  Discussed use of scent free antiperspirant (like dove) and update

## 2024-04-15 ENCOUNTER — Ambulatory Visit: Admitting: Family Medicine

## 2024-04-23 ENCOUNTER — Encounter: Payer: Self-pay | Admitting: Urology

## 2024-08-13 ENCOUNTER — Ambulatory Visit: Admitting: Urology

## 2024-08-15 ENCOUNTER — Ambulatory Visit: Payer: Self-pay | Admitting: Urology

## 2024-08-20 ENCOUNTER — Ambulatory Visit: Admitting: Urology

## 2024-08-28 ENCOUNTER — Ambulatory Visit (INDEPENDENT_AMBULATORY_CARE_PROVIDER_SITE_OTHER)

## 2024-08-28 ENCOUNTER — Ambulatory Visit
Admission: RE | Admit: 2024-08-28 | Discharge: 2024-08-28 | Disposition: A | Discharge status: Home / Self Care | Source: Ambulatory Visit

## 2024-08-28 VITALS — BP 133/83 | HR 58 | Temp 99.4°F | Resp 16 | Wt 156.0 lb

## 2024-08-28 DIAGNOSIS — M79674 Pain in right toe(s): Secondary | ICD-10-CM

## 2024-08-28 DIAGNOSIS — M79645 Pain in left finger(s): Secondary | ICD-10-CM

## 2024-08-28 NOTE — ED Triage Notes (Signed)
 Pt presents with a bump on her right big toe x 2 weeks and right pointer finger causing pain x 1 week.

## 2024-08-28 NOTE — ED Provider Notes (Addendum)
 [facility]    CSN: [id number] Arrival date & time: 08/28/24  1329      History   Chief Complaint Chief Complaint  Patient presents with   Toe Pain    Lump on big toe and pointy finger causing pain. - Entered by patient    HPI Kathleen Kelley is a 33 y.o. female.   HPI  33 year old female with past medical history significant for PMDD, kidney stones, anxiety, and depression presents for evaluation of a bump on her right great toe that has been there for last 2 weeks and a bump on her left index finger that has been present for the last week.  No fever or drainage.  Past Medical History:  Diagnosis Date   Family history of GERD    History of depression    History of kidney stones    History of UTI    Renal calculi     Patient Active Problem List   Diagnosis Date Noted   History of kidney stones 03/04/2024   PMDD (premenstrual dysphoric disorder) 03/04/2024   Rash and nonspecific skin eruption 03/04/2024   Anxiety and depression 09/14/2023   Nephrolithiasis 09/13/2023    History reviewed. No pertinent surgical history.  OB History   No obstetric history on file.      Home Medications    Prior to Admission medications   Medication Sig Start Date End Date Taking? Authorizing Provider  busPIRone (BUSPAR) 5 MG tablet Take 5 mg by mouth. 07/24/24 07/24/25 Yes [provider]  Biotin w/ Vitamins C & E (HAIR SKIN & NAILS GUMMIES PO) Take by mouth.    [provider]  famotidine (PEPCID) 10 MG tablet Take 10 mg by mouth daily as needed for heartburn or indigestion.    [provider]  FLUoxetine  (PROZAC ) 10 MG capsule Take 1 capsule (10 mg total) by mouth daily. 03/04/24   Tower, Laine LABOR, MD  ipratropium (ATROVENT ) 0.06 % nasal spray Place 2 sprays into both nostrils 4 (four) times daily. 02/26/24   Arvis Huxley B, PA-C  ondansetron  (ZOFRAN -ODT) 4 MG disintegrating tablet Take 1 tablet (4 mg total) by mouth every 8 (eight) hours as  needed. 11/10/23   Arvis Huxley B, PA-C  tamsulosin  (FLOMAX ) 0.4 MG CAPS capsule 1 capsule daily as needed for recurrent stone symptoms 08/16/23   Twylla Glendia BROCKS, MD    Family History Family History  Problem Relation Age of Onset   Cancer Maternal Grandfather    Diabetes Paternal Grandmother    Hypertension Paternal Grandmother    Hyperlipidemia Paternal Grandmother    Hypertension Paternal Grandfather    Hyperlipidemia Paternal Grandfather    Diabetes Paternal Grandfather     Social History Social History   Tobacco Use   Smoking status: Never   Smokeless tobacco: Never  Vaping Use   Vaping status: Never Used  Substance Use Topics   Alcohol use: No   Drug use: Never     Allergies   Latex, Doxycycline , and Yaz [drospirenone-ethinyl estradiol]   Review of Systems Review of Systems  Musculoskeletal:  Positive for arthralgias and joint swelling.  Skin:  Negative for color change.     Physical Exam Triage Vital Signs ED Triage Vitals  Encounter Vitals Group     BP      Girls Systolic BP Percentile      Girls Diastolic BP Percentile      Boys Systolic BP Percentile      Boys Diastolic BP Percentile  Pulse      Resp      Temp      Temp src      SpO2      Weight      Height      Head Circumference      Peak Flow      Pain Score      Pain Loc      Pain Education      Exclude from Growth Chart    No data found.  Updated Vital Signs BP 133/83 (BP Location: Right Arm)   Pulse (!) 58   Temp 99.4 F (37.4 C) (Oral)   Resp 16   Wt 156 lb (70.8 kg)   LMP 08/09/2024   SpO2 99%   BMI 26.78 kg/m   Visual Acuity Right Eye Distance:   Left Eye Distance:   Bilateral Distance:    Right Eye Near:   Left Eye Near:    Bilateral Near:     Physical Exam Vitals and nursing note reviewed.  Constitutional:      Appearance: Normal appearance. She is not ill-appearing.  HENT:     Head: Normocephalic and atraumatic.  Musculoskeletal:        General:  Swelling, tenderness and signs of injury present.  Skin:    General: Skin is warm and dry.     Capillary Refill: Capillary refill takes less than 2 seconds.     Findings: No bruising or erythema.  Neurological:     General: No focal deficit present.     Mental Status: She is alert and oriented to person, place, and time.      UC Treatments / Results  Labs (all labs ordered are listed, but only abnormal results are displayed) Labs Reviewed - No data to display  EKG   Radiology No results found.  Procedures Procedures (including critical care time)  Medications Ordered in UC Medications - No data to display  Initial Impression / Assessment and Plan / UC Course  I have reviewed the triage vital signs and the nursing notes.  Pertinent labs & imaging results that were available during my care of the patient were reviewed by me and considered in my medical decision making (see chart for details).   Patient is a pleasant, nontoxic-appearing 33 year old female presenting for evaluation of painful swelling to her left index finger and right great toe as outlined in HPI above.  Patient again seen image above, there is a small area of edema at the DIP joint on the dorsal aspect.  This is firm and tender to palpation.  The patient reports that she sustained a laceration near the area about a month ago and then the swelling and pain developed a week ago.  I suspect this is most likely scar tissue formation as a result of the recent injury.  As for the patient's toe, the patient has a firm, palpable, not on the medial aspect of the IP joint of her right great toe that is tender to palpation.  There is no overlying erythema or ecchymosis to suggest injury.  She reports that the area is tender.  I will obtain a radiograph of the toe to evaluate for any bony growth or lesion.  Right great toe x-rays independently reviewed and evaluated by me.  Impression: No evidence of fracture or  dislocation.  No evidence of bony lesion.  Soft tissue swelling is present on the medial aspect of the IP joint.  Radiology overread is pending.  Radiology impression states negative exam.  I will discharge patient home with diagnosis of right fifth finger and right toe pain.  Given the soft tissue swelling that is present on the x-ray of her great toe I suspect that her toe was rubbing against the inside of her shoe and therefore we will advise the patient to get shoes with a wider toebox and to avoid anything with a narrow or pointed toebox for the time being.  I will also give her the contact information for podiatry with whom she can follow-up if her symptoms not improved.   Final Clinical Impressions(s) / UC Diagnoses   Final diagnoses:  Great toe pain, right  Finger pain, left     Discharge Instructions      As we discussed, I believe the pain in your left index finger is most likely coming from scar tissue formation as a result of your recent injury.  Your x-rays of your toe did not show any evidence of bony lesion though it does show soft tissue inflammation which suggests that your toe may be rubbing against the inside of your shoe.  I suggest that you wear shoes with a wider toebox and avoid any shoes with a narrow toebox or pointed toes to keep from applying greater pressure to the area.  You may use over-the-counter Tylenol  and/or ibuprofen as needed for any pain.  If your symptoms do not improve I would recommend following up with podiatry.  I have given you the contact information in your discharge instructions.     ED Prescriptions   None    PDMP not reviewed this encounter.   Bernardino Ditch, NP 08/28/24 1502    Bernardino Ditch, NP 08/28/24 [phone]

## 2024-08-28 NOTE — Discharge Instructions (Signed)
 As we discussed, I believe the pain in your left index finger is most likely coming from scar tissue formation as a result of your recent injury.  Your x-rays of your toe did not show any evidence of bony lesion though it does show soft tissue inflammation which suggests that your toe may be rubbing against the inside of your shoe.  I suggest that you wear shoes with a wider toebox and avoid any shoes with a narrow toebox or pointed toes to keep from applying greater pressure to the area.  You may use over-the-counter Tylenol  and/or ibuprofen as needed for any pain.  If your symptoms do not improve I would recommend following up with podiatry.  I have given you the contact information in your discharge instructions.

## 2024-09-30 ENCOUNTER — Ambulatory Visit: Admitting: Urology

## 2024-10-01 ENCOUNTER — Ambulatory Visit: Admitting: Podiatry

## 2024-10-01 DIAGNOSIS — M7989 Other specified soft tissue disorders: Secondary | ICD-10-CM | POA: Diagnosis not present

## 2024-10-01 NOTE — Progress Notes (Signed)
" ° °  Chief Complaint  Patient presents with   Toe Pain    Lump on right big toe x 5 weeks. Was seen in urgent care on 08/28/24. Soft Tissue inflammation.    HPI: 34 y.o. femalepresenting for Evaluation of a soft tissue lesion that developed to the dorsal aspect of the right great toe.  Onset week of Thanksgiving 2025.  The following week she went to the urgent care where x-rays were taken negative for any abnormality.  She continues to have some pain associated to the soft tissue mass that has developed to the great toe.    Denies a history of trauma or injury to the area or change in activity or shoe gear  Past Medical History:  Diagnosis Date   Family history of GERD    History of depression    History of kidney stones    History of UTI    Renal calculi     No past surgical history on file.  Allergies[1]   Physical Exam: General: The patient is alert and oriented x3 in no acute distress.  Dermatology: Skin is warm, dry and supple bilateral lower extremities.  No open lesions  Vascular: Palpable pedal pulses bilaterally. Capillary refill within normal limits.  No appreciable edema.  No erythema.  Neurological: Grossly intact via light touch  Musculoskeletal Exam: No pedal deformities noted.  There is a soft tissue mass noted to the dorsal aspect of the right great toe just distal to the IPJ.  It is somewhat fluctuant and not adhered with associated tenderness to palpation  DG Toe Great Right (Accession [id number]) (Order [id number]) Imaging Date: 08/28/2024 Department: [facility]  FINDINGS: There is no evidence of fracture or dislocation. There is no evidence of arthropathy or other focal bone abnormality. Soft tissues are unremarkable.   IMPRESSION: Negative.  Assessment/Plan of Care: 1.  Soft tissue mass unknown etiology dorsal aspect of the right great toe  - Patient evaluated.  X-rays taken on 08/28/2024 were reviewed. -Due to the concern of  the soft tissue mass and associated tenderness without improvement patient would like to go ahead and pursue MRI -MRI toes RT w/wo contrast ordered -Will plan to contact the patient after MRI results are available to review and discuss further treatment options -Return to clinic PRN  *Works at [employer], DPM [facility]  Dr. Thresa EMERSON Sar, DPM    [address] [place], [place] [address]                Office [phone]  Fax [phone]        [1]  Allergies Allergen Reactions   Latex    Doxycycline      esophagitis   Yaz [Drospirenone-Ethinyl Estradiol] Rash    Rash on breast    "

## 2024-10-03 ENCOUNTER — Other Ambulatory Visit: Payer: Self-pay | Admitting: *Deleted

## 2024-10-03 DIAGNOSIS — N200 Calculus of kidney: Secondary | ICD-10-CM

## 2024-10-15 ENCOUNTER — Ambulatory Visit: Admitting: Urology

## 2024-10-15 ENCOUNTER — Other Ambulatory Visit
